# Patient Record
Sex: Female | Born: 1955 | Race: White | Hispanic: No | Marital: Married | Smoking: Current every day smoker
Health system: Southern US, Community
[De-identification: ages and names within clinical notes are randomized; demographics above are authoritative.]

## PROBLEM LIST (undated history)

## (undated) DIAGNOSIS — I10 Essential (primary) hypertension: Secondary | ICD-10-CM

## (undated) DIAGNOSIS — C801 Malignant (primary) neoplasm, unspecified: Secondary | ICD-10-CM

## (undated) DIAGNOSIS — E119 Type 2 diabetes mellitus without complications: Secondary | ICD-10-CM

## (undated) DIAGNOSIS — G709 Myoneural disorder, unspecified: Secondary | ICD-10-CM

## (undated) DIAGNOSIS — G43909 Migraine, unspecified, not intractable, without status migrainosus: Secondary | ICD-10-CM

## (undated) HISTORY — DX: Migraine, unspecified, not intractable, without status migrainosus: G43.909

## (undated) HISTORY — DX: Essential (primary) hypertension: I10

## (undated) HISTORY — PX: CHOLECYSTECTOMY: SHX55

## (undated) HISTORY — DX: Type 2 diabetes mellitus without complications: E11.9

## (undated) HISTORY — PX: NECK SURGERY: SHX720

## (undated) HISTORY — DX: Malignant (primary) neoplasm, unspecified: C80.1

## (undated) HISTORY — DX: Myoneural disorder, unspecified: G70.9

## (undated) HISTORY — PX: OTHER SURGICAL HISTORY: SHX169

---

## 2005-10-08 ENCOUNTER — Ambulatory Visit (HOSPITAL_COMMUNITY): Admission: RE | Admit: 2005-10-08 | Discharge: 2005-10-08 | Payer: Self-pay | Admitting: Neurology

## 2005-10-20 ENCOUNTER — Ambulatory Visit (HOSPITAL_COMMUNITY): Admission: RE | Admit: 2005-10-20 | Discharge: 2005-10-20 | Payer: Self-pay | Admitting: Family Medicine

## 2005-10-27 ENCOUNTER — Ambulatory Visit (HOSPITAL_COMMUNITY): Admission: RE | Admit: 2005-10-27 | Discharge: 2005-10-27 | Payer: Self-pay | Admitting: Family Medicine

## 2006-03-15 ENCOUNTER — Ambulatory Visit (HOSPITAL_COMMUNITY): Admission: RE | Admit: 2006-03-15 | Discharge: 2006-03-15 | Payer: Self-pay | Admitting: Family Medicine

## 2006-08-27 ENCOUNTER — Emergency Department (HOSPITAL_COMMUNITY): Admission: EM | Admit: 2006-08-27 | Discharge: 2006-08-27 | Payer: Self-pay | Admitting: Emergency Medicine

## 2006-11-14 ENCOUNTER — Ambulatory Visit (HOSPITAL_COMMUNITY): Admission: RE | Admit: 2006-11-14 | Discharge: 2006-11-14 | Payer: Self-pay | Admitting: Family Medicine

## 2007-02-26 ENCOUNTER — Inpatient Hospital Stay (HOSPITAL_COMMUNITY): Admission: EM | Admit: 2007-02-26 | Discharge: 2007-02-27 | Payer: Self-pay | Admitting: Emergency Medicine

## 2007-02-27 ENCOUNTER — Ambulatory Visit: Payer: Self-pay | Admitting: Internal Medicine

## 2009-02-03 ENCOUNTER — Ambulatory Visit: Payer: Self-pay | Admitting: Cardiology

## 2009-03-05 ENCOUNTER — Ambulatory Visit (HOSPITAL_COMMUNITY): Admission: RE | Admit: 2009-03-05 | Discharge: 2009-03-05 | Payer: Self-pay | Admitting: Cardiovascular Disease

## 2009-03-11 ENCOUNTER — Ambulatory Visit (HOSPITAL_COMMUNITY): Admission: RE | Admit: 2009-03-11 | Discharge: 2009-03-11 | Payer: Self-pay | Admitting: Cardiovascular Disease

## 2009-07-07 ENCOUNTER — Ambulatory Visit (HOSPITAL_COMMUNITY): Admission: RE | Admit: 2009-07-07 | Discharge: 2009-07-07 | Payer: Self-pay | Admitting: Gastroenterology

## 2010-11-14 ENCOUNTER — Encounter: Payer: Self-pay | Admitting: Family Medicine

## 2011-02-02 LAB — GLUCOSE, CAPILLARY: Glucose-Capillary: 111 mg/dL — ABNORMAL HIGH (ref 70–99)

## 2011-03-09 NOTE — Cardiovascular Report (Signed)
NAMEUMI, MAINOR NO.:  [id number]   MEDICAL RECORD NO.:  [id number]          PATIENT TYPE:  OIB   LOCATION:  2899                         FACILITY:  [facility]   PHYSICIAN:  Nanetta Batty, M.D.   DATE OF BIRTH:  Apr 09, 1956   DATE OF PROCEDURE:  DATE OF DISCHARGE:  03/11/2009                            CARDIAC CATHETERIZATION   Ms. Kivett is a 55 year old mildly overweight married Caucasian female  mother of 7, grandmother of 8 grandchildren who currently does not work  and was referred by Dr. Loney Hering for ongoing chest pain.  Risk factors  include ongoing tobacco abuse, noninsulin-requiring diabetes,  hyperlipidemia and hypertension.  She had a stress echo that showed  inferobasal wall motion abnormality.  She presents now for diagnostic  coronary arteriography to define anatomy and rule out ischemic etiology.   DESCRIPTION OF PROCEDURE:  The patient was brought to the Second Floor  [facility] Cardiac Cath Lab in a postabsorptive state.  She was  premedicated with p.o. Valium.  Her right groin was prepped and shaved  in the usual sterile fashion.  Xylocaine 1% was used for local  anesthesia.  A 6-French sheath was inserted into the right femoral  artery using standard Seldinger technique.  A 6-French right and left  Judkins diagnostic catheter as well as a 6-French pigtail catheter were  used for selective coronary angiography and left ventriculography  respectively.  Visipaque dye was used for the entirety of the case.  Retrograde aortic, left ventricular and pullback blood pressures were  recorded.   HEMODYNAMICS:  1. Aortic systolic pressure 124 and diastolic 68.  2. Left ventricular systolic pressure 120, end-diastolic pressure 12.   SELECTIVE CORONARY ANGIOGRAPHY:  1. Left main normal.  2. LAD normal.  3. Left circumflex normal.  4. Right coronary artery is dominant normal.   Left ventriculography; RAO left ventriculogram was performed using 25 mL  of  Visipaque dye at 12 mL per second.  The overall LVEF was estimated  greater than 60% without focal wall motion abnormalities.   IMPRESSION:  Ms. Dault has essentially normal coronary arteries and  normal LV function.  I believe her chest pain is noncardiac.  Antireflux  therapy will be recommended.   The sheath was removed and pressure was held in the groin to achieve  hemostasis.  The patient left the lab in stable condition.  She will be  discharged home later today as an outpatient.  Will see me back in the  office in 1-2 weeks for followup.      Nanetta Batty, M.D.  Electronically Signed     JB/MEDQ  D:  03/11/2009  T:  03/12/2009  Job:  [id number]   cc:   Second Floor [facility]  [facility]  Ernestine Conrad, MD

## 2011-03-12 NOTE — Discharge Summary (Signed)
NAMERHILYN, BATTLE NO.:  [id number]   MEDICAL RECORD NO.:  [id number]          PATIENT TYPE:  INP   LOCATION:  A213                          FACILITY:  [facility]   PHYSICIAN:  Melvyn Novas, MDDATE OF BIRTH:  06-25-1956   DATE OF ADMISSION:  02/26/2007  DATE OF DISCHARGE:  05/05/2008LH                               DISCHARGE SUMMARY   The patient is a 55 year old white female with a history of  hypertension, some GERD, and hypothyroidism.  She was admitted to the  hospital with diffuse abdominal pain, worse post prandial, cramping and  diarrhea.  She denied any nausea, vomiting, melena, hematemesis, or  hematochezia.  She was admitted by Dr. Renard Matter.  Abdominal series x-rays  were within normal limits.  CT scan of her abdomen and pelvis with  contrast were essentially normal with the exception of a left uterine  adnexa mass consistent with either a fibroid or possible ovarian tissue.  This was not felt to be clinically significant to her course of action.  She was placed on clear liquids, had no recurrent nausea, vomiting, or  abdominal pain subsided.  The patient ate a biscuit from outside the  hospital, unknown to the hospital staff.  She seemed to tolerate this  well, although she did have a little diarrhea.  Abdomen was essentially  unremarkable.  Labs were within normal limits.  And, the aforementioned  studies were with no electrolyte abnormalities.   While in the hospital she had a vagal episode apparently, and was  hemodynamically stable throughout hospital stay having no anginal chest  pain.  She was subsequently discharged.   She is on:  1. Benicar 20/12.5  2. Nexium 40 a day.  3. Synthroid 50 mcg per day.   She will follow up in the office in 2 days' time.   The patient was given a discourse on lactose intolerance which may be  possible as part of the etiology of this pain and post prandial  diarrhea.  Will follow subsequent abstinence  for three days from lactose  foods and then re-institution, preferably on a day off, and follow  symptomatology.      Melvyn Novas, MD  Electronically Signed     RMD/MEDQ  D:  02/27/2007  T:  02/27/2007  Job:  [id number]

## 2011-03-12 NOTE — Consult Note (Signed)
Kelsey Conley, Kelsey Conley                 ACCOUNT NO.:  [id number]   MEDICAL RECORD NO.:  [id number]          PATIENT TYPE:  INP   LOCATION:  A213                          FACILITY:  [facility]   PHYSICIAN:  R. Roetta Sessions, M.D. DATE OF BIRTH:  March 26, 1956   DATE OF CONSULTATION:  02/27/2007  DATE OF DISCHARGE:                                 CONSULTATION   REASON FOR CONSULTATION:  Abdominal pain.   HISTORY OF PRESENT ILLNESS:  Kelsey Conley is a 55 year old Caucasian female  with complaints of low back pain which showed about 5 days ago, she felt  like she strained her back.  She also complains of myalgias, fever, and  malaise.  Approximately 2 days ago she began to develop right-sided  abdominal pain.  She describes the pain as sharp, intermittent, it  usually lasts a couple of minutes.  She also complains of loose stools  about three per day but only when she eats.  She complains of nausea  without emesis.  Denies any rectal bleeding or melena.  She has a  history of chronic heartburn and indigestion.  She tells me it is well  controlled on Nexium but from what she tells me it does sound like she  has breakthrough symptoms at least a couple of times per month.  She  complains of anorexia but denies any early satiety or weight loss.  She  complains of dizziness, severe headache.  She saw Dr. Janna Arch and was  diagnosed with the flu last week, she was given Avelox and has taken  three doses at home.  She had a CT of abdomen and pelvis with IV normal  contrast yesterday she was found to have a left adnexa soft tissue mass.  She has a history of a normal Pap in January and February of this year.  She also has a history of possible uterine or cervical lesion back many  years ago.   PAST MEDICAL AND SURGICAL HISTORY:  1. Hypertension.  2. Gastroesophageal reflux disease.  3. Hypothyroidism.  4. Chronic low back pain.  5. Diabetes mellitus.  6. Status post cholecystectomy.  7. Tubal ligation.  8. Lumpectomy.  9. History of cervical cancer or what she believes was possible      cervical cancer.  She also tells me she has had an abnormal lymph      node removed from her neck.  She is listed on the cancer registry      in [place].  She is listed on the cancer registry in [place]      [place], [place] but she does not recall what type of cancer      she had.   MEDICATIONS PRIOR TO ADMISSION:  1. Nexium 40 mg daily.  2. Celebrex 200 mg daily.  3. Benicar once daily.  4. Levothyroxine unknown dose daily.  5. Avelox.   ALLERGIES:  Aspirin causes nausea and vomiting.  Codeine causes nausea  and vomiting.   FAMILY HISTORY:  There is no known family history of carcinoma or  chronic GI problems.  Mother has multiple health  problems, in her 85s.  Father died in his 69s with history of CVA, aortic aneurysm and lung  cancer.  She has multiple siblings with a history positive for  uterine/cervical cancer, diabetes mellitus, hypertension.   SOCIAL HISTORY:  She has been married for 12 years, she has five healthy  children.  She is a Chiropodist.  She has a 34 pack-year  history of tobacco use.  Denies alcohol or drug use.   REVIEW OF SYSTEMS:  CONSTITUTION:  She is complaining of fatigue, see  HPI.  CARDIOVASCULAR:  No history of chest pain or palpitations,  shortness of breath, dyspnea, cough, or hemoptysis.  GI:  See HPI.  GYN:  Last menstrual period was March 2008.  She has very irregular cycles,  felt to be perimenopausal.  GU:  Denies any dysuria, hematuria,  increased urinary frequency.   PHYSICAL EXAMINATION:  VITAL SIGNS:  Temperature 97.9, pulse 48,  respirations 18, blood pressure 107/54, O2 sat 95% on 2 liters per  minute, height 68 inches, weight 205 pounds.  GENERAL:  Kelsey Conley is a 55 year old Caucasian female who is alert,  oriented, pleasant, cooperative, in no acute distress.  She was  accompanied by her daughter.  HEENT:  Sclerae are clear,  nonicteric.  Conjunctivae are pink.  Oropharynx pink and moist without lesions.  NECK:  Supple without mass or thyromegaly.  CHEST:  Heart regular rhythm, normal S1 and S2.  Apical pulse is 50.  LUNGS:  Clear to auscultation bilaterally.  ABDOMEN:  Positive bowel sounds x4.  No bruits auscultated.  She does  have mild tenderness to the right upper quadrant and right lower  quadrant and around the umbilicus on palpation.  No rebound tenderness  or guarding.  EXTREMITIES:  Without clubbing or edema bilaterally.   LABORATORY DATA:  The labs from Feb 26, 2007, WBC 3.4, hemoglobin 13.7,  hematocrit 40, platelets 149,000, calcium 9.1, sodium 139, potassium  3.9, chloride 103, CO2 25, BUN 10, creatinine 0.66 and glucose 130.  Total bilirubin is 0.4, alkaline phosphatase 666, AST 32, ALT 37 total  protein 6.9, albumin 3.7.  Urinalysis has a high specific gravity.  Blood cultures negative times 24 hours.   IMPRESSION:  [name] is a 55 year old female with flu-like symptoms  including dizziness, headache, nausea, myalgias, low back pain, fever  and malaise also complains of right abdominal pain is intermittent,  lasts a few minutes and a few loose stools.  She has a history of  chronic gastroesophageal reflux disease.  CT shows left adnexal soft  tissue mass which will need further characterization but does not seem  to correlate with pain.  The etiology of the pain is unclear but may be  linked to an acute viral illness, it is possible she could have referred  back pain.  She seems much improved now and says I want to go home.  I  am also concerned about her breathing cardia and a low back pain and  unsure as to whether these two things are related.   PLAN:  1. Primary care to address for bradycardia workup as well as further      imaging of her left adnexal mass and possibly imaging of her      complaints of back pain.  2. Add PPI daily while here. 3. Supportive measures.  4. We will  recheck LFTs, CBC in the morning and we will continue to      follow and see what becomes  of her abdominal pain.  We would like      to thank Dr. Renard Matter for allowing [place] to participate in the care of      Ms. Victory Dakin.   PROCEDURE:  As well as Dr. room for signature and 37 is a Dr. Roe Coombs he  agrees.  Thank you      Nicholas Lose, N.P.      Jonathon Bellows, M.D.  Electronically Signed    KC/MEDQ  D:  02/27/2007  T:  02/27/2007  Job:  [id number]   cc:   Melvyn Novas, MD  Fax: [phone]

## 2011-03-12 NOTE — H&P (Signed)
NAMEKALKIDAN, CAUDELL                 ACCOUNT NO.:  [id number]   MEDICAL RECORD NO.:  [id number]          PATIENT TYPE:  INP   LOCATION:  A213                          FACILITY:  [facility]   PHYSICIAN:  Angus G. Renard Matter, MD   DATE OF BIRTH:  06/03/1956   DATE OF ADMISSION:  02/26/2007  DATE OF DISCHARGE:  LH                              HISTORY & PHYSICAL   HISTORY OF PRESENT ILLNESS:  A 55 year old female who presented to the  ED with a chief complaint being abdominal pain.  This had been present  for about 3 days.  Apparently, she had flu-like symptoms and had been  placed on an antibiotic, Avelox.  She continued to have abdominal pain,  nausea, fever, generalized body aches.  The pain is located in the upper  abdomen characterized as colicky and crampy pain, rated 10/10 at its  worst.  The patient did have lab work done through the ED. CBC revealed  WBC of 3400 with hemoglobin 13.7, hematocrit 40.0.  Chemistry revealed  sodium of 139, potassium 3.9, chloride 103, CO2 of 25, glucose 130, BUN  10, creatinine 0.66.  GFR greater than 60.  Urinalysis essentially  negative.  X-rays - the patient had a CT of the abdomen.  Appendix and  terminal ileum normal.  A soft tissue mass adjacent to the fundus of the  uterus.  Acute abdominal series was essentially negative.  The patient  was subsequently admitted and was given Dilaudid for pain in the ED and  started on intravenous fluids.   SOCIAL HISTORY:  The patient does not drink alcohol.  Discontinued  smoking recently.   FAMILY HISTORY:  Positive for of coronary artery disease, cancer,  diabetes and hypertension.   PAST MEDICAL HISTORY:  1. History of hypertension.  2. Gastroesophageal reflux.  3. Hypothyroidism.  4. Low back pain.   ALLERGIES:  1. ASPIRIN.  2. CODEINE.   MEDICATION LIST:  1. Nexium 40 mg daily.  2. Celebrex 200 mg daily.  3. Benicar daily.  4. Levothyroxine daily.   REVIEW OF SYSTEMS:  HEENT:  Negative.   CARDIOVASCULAR:  Negative for  chest pain.  No cough, dyspnea or wheezing.  GI:  The patient has had  nausea, abdominal pain and diarrhea.  GU:  No dysuria or hematuria.   PHYSICAL EXAMINATION:  VITAL SIGNS:  Blood pressure 103/71, pulse 64,  respirations 20, temperature 98.  HEENT:  Eyes - pupils equal, round and reactive to light and  accommodation. Tympanic membranes negative.  Oropharynx benign.  NECK:  Supple.  No JVD or thyroid abnormalities.  HEART:  Regular rhythm, no murmurs.  LUNGS:  Clear to percussion and auscultation.  ABDOMEN:  No palpable organs, no masses.  Tenderness in the right lower  quadrant with muscle guarding and rebound tenderness.   ASSESSMENT:  The patient was admitted with abdominal pain, right lower  quadrant, undetermined etiology.      Angus G. Renard Matter, MD  Electronically Signed     AGM/MEDQ  D:  02/26/2007  T:  02/26/2007  Job:  [id number]

## 2012-12-28 DIAGNOSIS — E1149 Type 2 diabetes mellitus with other diabetic neurological complication: Secondary | ICD-10-CM | POA: Diagnosis not present

## 2012-12-28 DIAGNOSIS — R209 Unspecified disturbances of skin sensation: Secondary | ICD-10-CM | POA: Diagnosis not present

## 2012-12-28 DIAGNOSIS — K219 Gastro-esophageal reflux disease without esophagitis: Secondary | ICD-10-CM | POA: Diagnosis not present

## 2012-12-28 DIAGNOSIS — I10 Essential (primary) hypertension: Secondary | ICD-10-CM | POA: Diagnosis not present

## 2012-12-28 DIAGNOSIS — E785 Hyperlipidemia, unspecified: Secondary | ICD-10-CM | POA: Diagnosis not present

## 2012-12-28 DIAGNOSIS — E039 Hypothyroidism, unspecified: Secondary | ICD-10-CM | POA: Diagnosis not present

## 2013-01-22 DIAGNOSIS — E1139 Type 2 diabetes mellitus with other diabetic ophthalmic complication: Secondary | ICD-10-CM | POA: Diagnosis not present

## 2013-01-22 DIAGNOSIS — H18519 Endothelial corneal dystrophy, unspecified eye: Secondary | ICD-10-CM | POA: Diagnosis not present

## 2013-01-22 DIAGNOSIS — H5200 Hypermetropia, unspecified eye: Secondary | ICD-10-CM | POA: Diagnosis not present

## 2013-01-22 DIAGNOSIS — H2510 Age-related nuclear cataract, unspecified eye: Secondary | ICD-10-CM | POA: Diagnosis not present

## 2013-01-29 DIAGNOSIS — E785 Hyperlipidemia, unspecified: Secondary | ICD-10-CM | POA: Diagnosis not present

## 2013-01-29 DIAGNOSIS — I10 Essential (primary) hypertension: Secondary | ICD-10-CM | POA: Diagnosis not present

## 2013-01-29 DIAGNOSIS — E1149 Type 2 diabetes mellitus with other diabetic neurological complication: Secondary | ICD-10-CM | POA: Diagnosis not present

## 2013-02-01 ENCOUNTER — Telehealth (HOSPITAL_COMMUNITY): Payer: Self-pay | Admitting: Dietician

## 2013-02-01 NOTE — Telephone Encounter (Signed)
Received voicemail from [name] in referrals at [name] left at [phone]. Referral from Dr. Gaylyn Lambert for dx: diabetes. Appointment scheduled for 02/08/13 at 0900. Phone number and fax provided to send medical records.

## 2013-02-02 DIAGNOSIS — E1139 Type 2 diabetes mellitus with other diabetic ophthalmic complication: Secondary | ICD-10-CM | POA: Diagnosis not present

## 2013-02-02 DIAGNOSIS — H18519 Endothelial corneal dystrophy, unspecified eye: Secondary | ICD-10-CM | POA: Diagnosis not present

## 2013-02-06 ENCOUNTER — Ambulatory Visit (INDEPENDENT_AMBULATORY_CARE_PROVIDER_SITE_OTHER): Payer: Medicare Other | Admitting: Obstetrics & Gynecology

## 2013-02-06 ENCOUNTER — Encounter: Payer: Self-pay | Admitting: Obstetrics & Gynecology

## 2013-02-06 VITALS — BP 120/80 | Ht 68.0 in | Wt 202.0 lb

## 2013-02-06 DIAGNOSIS — N813 Complete uterovaginal prolapse: Secondary | ICD-10-CM | POA: Insufficient documentation

## 2013-02-06 DIAGNOSIS — N814 Uterovaginal prolapse, unspecified: Secondary | ICD-10-CM

## 2013-02-06 NOTE — Patient Instructions (Signed)
Prolapse  Prolapse means the falling down, bulging, dropping, or drooping of a body part. Organs that commonly prolapse include the rectum, small intestine, bladder, urethra, vagina (birth canal), uterus (womb), and cervix. Prolapse occurs when the ligaments and muscle tissue around the rectum, bladder, and uterus are damaged or weakened.  CAUSES  This happens especially with:  Childbirth. Some women feel pelvic pressure or have trouble holding their urine right after childbirth, because of stretching and tearing of pelvic tissues. This generally gets better with time and the feeling usually goes away, but it may return with aging.  Chronic heavy lifting.  Aging.  Menopause, with loss of estrogen production weakening the pelvic ligaments and muscles.  Past pelvic surgery.  Obesity.  Chronic constipation.  Chronic cough. Prolapse may affect a single organ, or several organs may prolapse at the same time. The front wall of the vagina holds up the bladder. The back wall holds up part of the lower intestine, or rectum. The uterus fills a spot in the middle. All these organs can be involved when the ligaments and muscles around the vagina relax too much. This often gets worse when women stop producing estrogen (menopause). SYMPTOMS  Uncontrolled loss of urine (incontinence) with cough, sneeze, straining, and exercise.  More force may be required to have a bowel movement, due to trapping of the stool.  When part of an organ bulges through the opening of the vagina, there is sometimes a feeling of heaviness or pressure. It may feel as though something is falling out. This sensation increases with coughing or bearing down.  If the organs protrude through the opening of the vagina and rub against the clothing, there may be soreness, ulcers, infection, pain, and bleeding.  Lower back pain.  Pushing in the upper or lower part of the vagina, to pass urine or have a bowel movement.  Problems  having sexual intercourse.  Being unable to insert a tampon or applicator. DIAGNOSIS  Usually, a physical exam is all that is needed to identify the problem. During the examination, you may be asked to cough and strain while lying down, sitting up, and standing up. Your caregiver will determine if more testing is required, such as bladder function tests. Some diagnoses are:  Cystocele: Bulging and falling of the bladder into the top of the vagina.  Rectocele: Part of the rectum bulging into the vagina.  Prolapse of the uterus: The uterus falls or drops into the vagina.  Enterocele: Bulging of the top of the vagina, after a hysterectomy (uterus removal), with the small intestine bulging into the vagina. A hernia in the top of the vagina.  Urethrocele: The urethra (urine carrying tube) bulging into the vagina. TREATMENT  In most cases, prolapse needs to be treated only if it produces symptoms. If the symptoms are interfering with your usual daily or sexual activities, treatment may be necessary. The following are some measures that may be used to treat prolapse.  Estrogen may help elderly women with mild prolapse.  Kegel exercises may help mild cases of prolapse, by strengthening and tightening the muscles of the pelvic floor.  Pessaries are used in women who choose not to, or are unable to, have surgery. A pessary is a doughnut-shaped piece of plastic or rubber that is put into the vagina to keep the organs in place. This device must be fitted by your caregiver. Your caregiver will also explain how to care for yourself with the pessary. If it works well for you,   to keep the organs in place. This device must be fitted by your caregiver. Your caregiver will also explain how to care for yourself with the pessary. If it works well for you, this may be the only treatment required.   Surgery is often the only form of treatment for more severe prolapses. There are different types of surgery available. You should discuss what the best procedure is for you. If the uterus is prolapsed, it may be removed (hysterectomy) as part of the surgical treatment. Your caregiver will  discuss the risks and benefits with you.   Uterine-vaginal suspension (surgery to hold up the organs) may be used, especially if you want to maintain your fertility.  No form of treatment is guaranteed to correct the prolapse or relieve the symptoms.  HOME CARE INSTRUCTIONS    Wear a sanitary pad or absorbent product if you have incontinence of urine.   Avoid heavy lifting and straining with exercise and work.   Take over-the-counter pain medicine for minor discomfort.   Try taking estrogen or using estrogen vaginal cream.   Try Kegel exercises or use a pessary, before deciding to have surgery.   Do Kegel exercises after having a baby.  SEEK MEDICAL CARE IF:    Your symptoms interfere with your daily activities.   You need medicine to help with the discomfort.   You need to be fitted with a pessary.   You notice bleeding from the vagina.   You think you have ulcers or you notice ulcers on the cervix.   You have an oral temperature above 102 F (38.9 C).   You develop pain or blood with urination.   You have bleeding with a bowel movement.   The symptoms are interfering with your sex life.   You have urinary incontinence that interferes with your daily activities.   You lose urine with sexual intercourse.   You have a chronic cough.   You have chronic constipation.  Document Released: 04/17/2003 Document Revised: 01/03/2012 Document Reviewed: 10/26/2009  [employer] Patient Information 2013 [employer].

## 2013-02-07 NOTE — Progress Notes (Signed)
Patient ID: AMELIE CARACCI, female   DOB: 1956/02/15, 57 y.o.   MRN: [id number] Has felt like "something falling out of my vagina" for 2-3 years.  No acute changes Incontinence with urgency no real stress symptoms No fecal incontinence Has sex less than 1 time per month, libido + erection dysfunction  Exam Grade II-III prolapse of anterior vaginal wall, cystocoele Grade II uterine relaxation Grade II-III distal rectal prolapse, pt denies splinting  Talked extensively with pt regarding physical findings.  She is not interested in surgery at this point and i agree, she is a smoker and would need to stop prior to surgery and needs to be older or more severe findings Wants to try a pessary, so will see backin 2 weeks to fit with a milex ring with support  EURE,LUTHER H 02/07/2013 9:38 PM

## 2013-02-08 ENCOUNTER — Encounter (HOSPITAL_COMMUNITY): Payer: Self-pay | Admitting: Dietician

## 2013-02-08 NOTE — Progress Notes (Signed)
Outpatient Initial Nutrition Assessment  Date:02/08/2013   Appt Start Time: [id number]  Referring Physician: Dr. Elias Else [facility]) Reason for Visit: diabetes  Nutrition Assessment:  Height: 5\' 9"  (175.3 cm)   Weight: 206 lb (93.441 kg)   IBW: 145# %IBW: 142% UBW: 206# %UBW: 100% Body mass index is 30.41 kg/(m^2). Classified as obesity, class I.  Goal Weight: 186# (10% loss of current body weight) Weight hx: Pt reports UBW around 200-205#. Pt c/o gaining weight, even with minimal food intake and decreased appetite. She has been actively trying to lose weight.   Estimated nutritional needs: 1600-1700 kcals daily, 75-94 grams protein daily, 1.6-1.7 L fluid daily  PMH:  Past Medical History  Diagnosis Date  . Diabetes mellitus without complication   . Hypertension   . Cancer     lymph node right neck  . Neuromuscular disorder     neuropathy  . Migraines     Medications:  Current Outpatient Rx  Name  Route  Sig  Dispense  Refill  . DULoxetine (CYMBALTA) 60 MG capsule   Oral   Take 60 mg by mouth 2 (two) times daily.         Marland Kitchen esomeprazole (NEXIUM) 40 MG capsule   Oral   Take 40 mg by mouth 2 (two) times daily.         Marland Kitchen gabapentin (NEURONTIN) 300 MG capsule   Oral   Take 300 mg by mouth 3 (three) times daily.         Marland Kitchen glimepiride (AMARYL) 2 MG tablet   Oral   Take 2 mg by mouth 2 (two) times daily.         Marland Kitchen levothyroxine (SYNTHROID, LEVOTHROID) 175 MCG tablet   Oral   Take 175 mcg by mouth daily before breakfast.         . metFORMIN (GLUCOPHAGE) 1000 MG tablet   Oral   Take 1,000 mg by mouth 2 (two) times daily with a meal.         . olmesartan-hydrochlorothiazide (BENICAR HCT) 20-12.5 MG per tablet   Oral   Take 1 tablet by mouth daily.         . Polyethylene Glycol 3350 POWD   Does not apply   3,350 g by Does not apply route as needed.         . pravastatin (PRAVACHOL) 40 MG tablet   Oral   Take 40 mg by mouth at  bedtime.           Labs: CMP  No results found for this basename: na, k, cl, co2, glucose, bun, creatinine, calcium, prot, albumin, ast, alt, alkphos, bilitot, gfrnonaa, gfraa    Lipid Panel  No results found for this basename: chol, trig, hdl, cholhdl, vldl, ldlcalc     No results found for this basename: HGBA1C   No results found for this basename: GLUF, MICROALBUR, LDLCALC, CREATININE     Lifestyle/ social habits: Ms. Searles is a very pleasant woman who lives in [place] with her husband and adult son. She also frequently cares for her two 72 year old granddaughters. She is currently on disability. She reports her stress level as very high, reporting her multiple family obligations and her health as her main sources of stress. She smokes, but reports she does not use alcohol or illegal drugs. She does not follow a structured exercise plan. She reports decreased energy levels. She is active with her grandchildren and reports that she has  started mowing her lawn with a push mower.    Nutrition hx/habits: Ms Saulter reports that she has had diabetes for about 8 years and has never received diabetes education in the past. She is very eager to learn. She reports "my last doctor just told me to avoid sugar". She has recently been reducing fried foods and salt in her diet. She reports that she has had problems with glycemic control since she has been diagnosed, but most recently about a few months ago; due to insurance issues, she did not have a PCP for over 1 year and she decreased her dosages of medications to help her get by. She was not checking her glucose at that time because she ran out of testing strips. CBGs typically run between 132-219 per log, however since pt was pt on Glimepiride (around 01/30/13) pt log indicates AM fasting readings between 97-188 and PM postprandial readings between 91-210. She reports she typically eats 1 meal per day, due to consistent nausea. She reports if she tries to eat  in the morning, she will throw up her food and medications. She also complains of bloating, gas, diarrhea, and constipation. She has neuropathy and reports her hands and feet are numb. She does not eat much meat (she reports she gets constipated if she eats it), but get protein by way of nuts and peanut butter. She consumes a lot of vegetables and fruits.   Diet recall: Breakfast: 2 cups of coffee with sugar and creamer; Dinner (3-4 PM): pinto beans, onions, and turnip greens  Nutrition Diagnosis: Inconsistent carbohydrate intake r/t skipping meals AEB diet recall.   Nutrition Intervention:  Nutrition rx: 1500 kcal NAS, diabetic diet; 4-6 small meals per day (15-30 grams carbohydrates); low calorie beverages only; physical activity as tolerated  Education/Counseling Provided: Educated pt on principles of diabetic diet. Discussed carbohydrate metabolism in relation to diabetes. Educated pt on basic self-management principles including: signs and symptoms of hyperglycemia and hypoglycemia, goals for fasting and postprandial blood sugars, goals for Hgb A1c, importance of checking feet, importance of keeping PCP appointments, and foot care. Educated pt on plate method, portion sizes, and sources of carbohydrate. Discussed importance of regular meal pattern. Discussed importance of adding sources of whole grains to diet to improve glycemic control. Also encouraged to choose low fat dairy, lean meats, and whole fruits and vegetables more often. Discussed options of artificial sweeteners and encouraged pt to use which brand she liked best. Discussed nutritional content of foods commonly eaten and discussed healthier alternatives. Discussed ways  To increase PO intake and eat on a schedule; recommended eating small, frequent meals 4-6 times per day in order to help stabilize glycemic levels and promote adequate intake. Discussed importance of compliance to prevent further complications of disease. Educated pt on  importance of physical activity (goal of at least 30 minutes 5 times per week) along with a healthy diet to achieve weight loss and glycemic goals. Encouraged slow, moderate weight loss of 1-2# per week, or 7-10% of current body weight. Provided plate method, "Carbdohydrate Counting and Meal Planning", "My Blood Sugar Diary", "Guice to Better Office Visits", and "Diabetes and You". handouts. Used TeachBack to assess understanding.   Understanding, Motivation, Ability to Follow Recommendations: Expect fair compliance.   Monitoring and Evaluation: Goals: 1) 1-2# wt loss per week; 2) 4-6 small meals per day; 3) Hgb A1c < 7,0; 4) Physical activity as tolerated  Recommendations: 1) Break up meals into smaller, more frequent meals (4-6 small meals per  day); 2) Break up exercise into smaller, more frequent sessions; 3) Take some time for yourself to explore hobbies and interests to help relieve stress; 4) Pt may benefit from a GI consultation (pt c/o early satiety, frequent nausea); 5) Pt may also benefit from a meal replacement supplement (ex. Glucerna or Ensure)  F/U: PRN. Provided RD contact information.   Melody Haver, RD, LDN 02/08/2013  Appt EndTime: 1115

## 2013-02-20 ENCOUNTER — Ambulatory Visit: Payer: Medicare Other | Admitting: Obstetrics & Gynecology

## 2013-02-27 DIAGNOSIS — J9801 Acute bronchospasm: Secondary | ICD-10-CM | POA: Diagnosis not present

## 2013-02-27 DIAGNOSIS — E119 Type 2 diabetes mellitus without complications: Secondary | ICD-10-CM | POA: Diagnosis not present

## 2013-02-27 DIAGNOSIS — E785 Hyperlipidemia, unspecified: Secondary | ICD-10-CM | POA: Diagnosis not present

## 2013-02-27 DIAGNOSIS — F17200 Nicotine dependence, unspecified, uncomplicated: Secondary | ICD-10-CM | POA: Diagnosis not present

## 2013-03-05 DIAGNOSIS — H18519 Endothelial corneal dystrophy, unspecified eye: Secondary | ICD-10-CM | POA: Diagnosis not present

## 2013-03-05 DIAGNOSIS — H524 Presbyopia: Secondary | ICD-10-CM | POA: Diagnosis not present

## 2013-03-08 DIAGNOSIS — E785 Hyperlipidemia, unspecified: Secondary | ICD-10-CM | POA: Diagnosis not present

## 2013-04-10 DIAGNOSIS — H53149 Visual discomfort, unspecified: Secondary | ICD-10-CM | POA: Diagnosis not present

## 2013-04-10 DIAGNOSIS — H18519 Endothelial corneal dystrophy, unspecified eye: Secondary | ICD-10-CM | POA: Diagnosis not present

## 2013-04-10 DIAGNOSIS — H2510 Age-related nuclear cataract, unspecified eye: Secondary | ICD-10-CM | POA: Diagnosis not present

## 2013-05-11 DIAGNOSIS — H53149 Visual discomfort, unspecified: Secondary | ICD-10-CM | POA: Diagnosis not present

## 2013-05-11 DIAGNOSIS — H18519 Endothelial corneal dystrophy, unspecified eye: Secondary | ICD-10-CM | POA: Diagnosis not present

## 2013-06-07 DIAGNOSIS — H903 Sensorineural hearing loss, bilateral: Secondary | ICD-10-CM | POA: Diagnosis not present

## 2013-06-07 DIAGNOSIS — H9319 Tinnitus, unspecified ear: Secondary | ICD-10-CM | POA: Diagnosis not present

## 2013-07-19 ENCOUNTER — Ambulatory Visit (INDEPENDENT_AMBULATORY_CARE_PROVIDER_SITE_OTHER): Payer: Medicare Other | Admitting: Nurse Practitioner

## 2013-07-19 ENCOUNTER — Encounter: Payer: Self-pay | Admitting: Nurse Practitioner

## 2013-07-19 VITALS — BP 128/86 | HR 67 | Ht 69.5 in | Wt 212.0 lb

## 2013-07-19 DIAGNOSIS — R296 Repeated falls: Secondary | ICD-10-CM

## 2013-07-19 DIAGNOSIS — Z9181 History of falling: Secondary | ICD-10-CM | POA: Diagnosis not present

## 2013-07-19 DIAGNOSIS — G609 Hereditary and idiopathic neuropathy, unspecified: Secondary | ICD-10-CM | POA: Diagnosis not present

## 2013-07-19 DIAGNOSIS — R269 Unspecified abnormalities of gait and mobility: Secondary | ICD-10-CM

## 2013-07-19 DIAGNOSIS — E119 Type 2 diabetes mellitus without complications: Secondary | ICD-10-CM | POA: Diagnosis not present

## 2013-07-19 NOTE — Progress Notes (Signed)
[facility]  PATIENT: Kelsey Conley DOB: 11/19/1955   REASON FOR VISIT: Followup for neuropathy   HISTORY OF PRESENT ILLNESS:Ms Kelsey Conley, is a 57 year old right-handed Caucasian female, initially evaluated by Dr. Debarah Crape 11/05/2011 for peripheral neuropathy. She has history of hypertension hyperlipidemia, continues to smoke and has type 2 diabetes. She is currently on Cymbalta 60 twice daily and gabapentin 300 , 3 tabs daily. She continues to complain of numbness and pain in both feet particularly the right foot. She also complains of bilateral hand numbness and pain, which may have progressed since last seen over 18 months. Labs for treatable causes of peripheral neuropathy all returned normal. EMG nerve conduction done 11/18/2011 with no evidence of large fiber peripheral neuropathy or right lumbar sacral radiculopathy. Patient says she has 40% hearing loss. She complains of multiple falls without reason. She does not use an assistive device. She is alone at today's visit however apparently her husband is in the lobby. She has never had CT or MRI of the brain. Also has mild tremor of the right hand.   HISTORY:She has past medical history of type 2 diabetes, most recent A1c was 6.8, previously was 8.4, also with past medical history of hypertension, hyperlipidemia, long-standing smoking. She reported 5 years history of gradual onset slow progressing bilateral feet paresthesia, she describe burning sensation in her toes, sometimes extremely cold, paresthesia in plantar surface of her feet, she also complains bilateral hands deep achy pain, swollen sensation, she has been taking gabapentin 600 mg 3 times a day with no help at all. She complains of gait difficulty because of the pain, also difficulty holding objects but She complains diffuse body muscle and joints achy pain, low back pain, frequent chest pain, reported previous negative cardiac workup   REVIEW OF SYSTEMS: Full 14  system review of systems performed and notable only for:  Constitutional: Weight gain, fatigue Cardiovascular: Chest pain, swelling in the legs Ear/Nose/Throat: Hearing loss  Skin: N/A  Eyes: Loss of vision, Fuch's Dystrophy Respiratory: Shortness of breath Gastroitestinal: N/A  Hematology/Lymphatic: Easy bruising  Endocrine: Feeling hot, increased thirst Musculoskeletal: Cramping  Allergy/Immunology: N/A  Neurological: Confusion, headache, tremor, weakness  Psychiatric: Decreased energy, insomnia  ALLERGIES: Allergies  Allergen Reactions  . Aspirin Nausea And Vomiting    headaches  . Codeine Nausea And Vomiting    HOME MEDICATIONS: Outpatient Prescriptions Prior to Visit  Medication Sig Dispense Refill  . DULoxetine (CYMBALTA) 60 MG capsule Take 60 mg by mouth 2 (two) times daily.      Marland Kitchen esomeprazole (NEXIUM) 40 MG capsule Take 40 mg by mouth 2 (two) times daily.      Marland Kitchen gabapentin (NEURONTIN) 300 MG capsule Take 300 mg by mouth 3 (three) times daily.      Marland Kitchen glimepiride (AMARYL) 2 MG tablet Take 2 mg by mouth 2 (two) times daily.      Marland Kitchen levothyroxine (SYNTHROID, LEVOTHROID) 175 MCG tablet Take 175 mcg by mouth daily before breakfast.      . metFORMIN (GLUCOPHAGE) 1000 MG tablet Take 1,000 mg by mouth 2 (two) times daily with a meal.      . olmesartan-hydrochlorothiazide (BENICAR HCT) 20-12.5 MG per tablet Take 1 tablet by mouth daily.      . Polyethylene Glycol 3350 POWD 3,350 g by Does not apply route as needed.      . pravastatin (PRAVACHOL) 40 MG tablet Take 40 mg by mouth at bedtime.       No facility-administered medications prior  to visit.    PAST MEDICAL HISTORY: Past Medical History  Diagnosis Date  . Diabetes mellitus without complication   . Hypertension   . Cancer     lymph node right neck  . Neuromuscular disorder     neuropathy  . Migraines     PAST SURGICAL HISTORY: Past Surgical History  Procedure Laterality Date  . Cholecystectomy    . Lower back  surgery      L4, L5, L3  . Tubaligation    . Neck surgery      FAMILY HISTORY: Family History  Problem Relation Age of Onset  . Migraines Mother   . Crohn's disease Mother   . Hypertension Mother   . Stroke Father   . Migraines Sister   . Diabetes Sister   . Hypertension Sister   . Thyroid disease Sister   . Cancer Maternal Aunt   . Cancer Maternal Uncle   . Cancer Paternal Uncle     brain, lung, liver, stomach  . Cancer Maternal Grandmother   . Diabetes Maternal Grandmother   . Cancer Maternal Grandfather     prostate    SOCIAL HISTORY: History   Social History  . Marital Status: Married    Spouse Name: N/A    Number of Children: N/A  . Years of Education: N/A   Occupational History  . Not on file.   Social History Main Topics  . Smoking status: Current Every Day Smoker -- 1.00 packs/day    Types: Cigarettes  . Smokeless tobacco: Not on file  . Alcohol Use: No  . Drug Use: No  . Sexual Activity: Yes   Other Topics Concern  . Not on file   Social History Narrative  . No narrative on file     PHYSICAL EXAM  Filed Vitals:   07/19/13 0858 07/19/13 0900  BP: 137/85 128/86  Pulse: 61 67  Height: 5' 9.5" (1.765 m)   Weight: 212 lb (96.163 kg)    Body mass index is 30.87 kg/(m^2).  Generalized: Well developed, obese female in no acute distress  Head: normocephalic and atraumatic,. Oropharynx benign  Neck: Supple, no carotid bruits  Cardiac: Regular rate rhythm, no murmur  Musculoskeletal: No deformity   Neurological examination   Mentation: Depressed in appearance . Alert oriented to time, place, history taking. Follows all commands speech and language fluent  Cranial nerve II-XII: Fundoscopic exam reveals sharp disc margins.Pupils were equal round reactive to light extraocular movements were full, visual field were full on confrontational test. Facial sensation and strength were normal. hearing was agrees bilaterally  Uvula tongue midline. head  turning and shoulder shrug and were normal and symmetric.Tongue protrusion into cheek strength was normal. Motor: normal bulk and tone, full strength in the BUE, BLE, fine finger movements normal, no pronator drift. No focal weakness Sensory: Length-dependent decreased to light touch, pinprick to mid shin and vibratory to Ankle, normal proprioception  Coordination: finger-nose-finger, tremor noted on right hand ,  no dysmetria Reflexes: Brachioradialis 2/2, biceps 2/2, triceps 2/2, patellar 2/2, Achilles 1/1 plantar responses were flexor bilaterally. Gait and Station: Rising up from seated position without assistance, narrow-based gait, labs on the right, able to tip toe and heel walk, unsteady with tandem. Romberg positive   DIAGNOSTIC DATA (LABS, IMAGING, TESTING) - None for review    ASSESSMENT AND PLAN  57 y.o. year old female  has a past medical history of Diabetes mellitus without complication; Hypertension; hyperlipidemia, peripheral neuropathy, gait abnormality, frequent falls for  followup. CMP after last visit 11/18/2011 normal except for elevated glucose, CBC normal TSH normal ANA negative, sedimentation rate normal vitamin B12 normal. Protein electrophoresis normal  Will get BMP today Schedule for MRI of the brain due to frequent falls and hand tremor Continue Gabapentin and Cymbalta for burning foot pain Discussed with Dr. Carmelina Noun, [facility], [facility], APRN  [facility] [address] [place], [place] [address] [phone]

## 2013-07-19 NOTE — Patient Instructions (Addendum)
Will get BMP today Schedule for MRI of the brain Continue Gabapentin and Cymbalta for burning foot pain

## 2013-07-20 ENCOUNTER — Other Ambulatory Visit (INDEPENDENT_AMBULATORY_CARE_PROVIDER_SITE_OTHER): Payer: Self-pay

## 2013-07-20 DIAGNOSIS — E119 Type 2 diabetes mellitus without complications: Secondary | ICD-10-CM | POA: Diagnosis not present

## 2013-07-20 DIAGNOSIS — Z0289 Encounter for other administrative examinations: Secondary | ICD-10-CM

## 2013-07-20 DIAGNOSIS — G609 Hereditary and idiopathic neuropathy, unspecified: Secondary | ICD-10-CM | POA: Diagnosis not present

## 2013-07-20 LAB — BASIC METABOLIC PANEL
Chloride: 99 mmol/L (ref 96–108)
GFR calc Af Amer: 113 mL/min/{1.73_m2} (ref >59)
GFR calc non Af Amer: 98 mL/min/{1.73_m2} (ref >59)
Potassium: 4.6 mmol/L (ref 3.5–5.2)
Sodium: 139 mmol/L (ref 134–144)

## 2013-07-22 DIAGNOSIS — R279 Unspecified lack of coordination: Secondary | ICD-10-CM

## 2013-07-23 ENCOUNTER — Ambulatory Visit
Admission: RE | Admit: 2013-07-23 | Discharge: 2013-07-23 | Disposition: A | Discharge status: Home / Self Care | Payer: Medicare Other | Source: Ambulatory Visit | Attending: Nurse Practitioner | Admitting: Nurse Practitioner

## 2013-07-23 DIAGNOSIS — R296 Repeated falls: Secondary | ICD-10-CM

## 2013-07-23 DIAGNOSIS — R269 Unspecified abnormalities of gait and mobility: Secondary | ICD-10-CM

## 2013-07-23 MED ORDER — GADOBENATE DIMEGLUMINE 529 MG/ML IV SOLN
20.0000 mL | Freq: Once | INTRAVENOUS | Status: AC | PRN
  Administered 2013-07-23: 20 mL via INTRAVENOUS

## 2013-07-24 NOTE — Progress Notes (Signed)
Quick Note:  Spoke with patient and relayed results of blood work. Patient understood and had no questions.  ______ 

## 2013-07-25 NOTE — Progress Notes (Signed)
Quick Note:  I called pt and normal brain scan results. She stated ok. ______

## 2013-08-30 DIAGNOSIS — Z23 Encounter for immunization: Secondary | ICD-10-CM | POA: Diagnosis not present

## 2013-08-30 DIAGNOSIS — E785 Hyperlipidemia, unspecified: Secondary | ICD-10-CM | POA: Diagnosis not present

## 2013-08-30 DIAGNOSIS — R209 Unspecified disturbances of skin sensation: Secondary | ICD-10-CM | POA: Diagnosis not present

## 2013-08-30 DIAGNOSIS — H18519 Endothelial corneal dystrophy, unspecified eye: Secondary | ICD-10-CM | POA: Diagnosis not present

## 2013-08-30 DIAGNOSIS — E1139 Type 2 diabetes mellitus with other diabetic ophthalmic complication: Secondary | ICD-10-CM | POA: Diagnosis not present

## 2013-08-30 DIAGNOSIS — R109 Unspecified abdominal pain: Secondary | ICD-10-CM | POA: Diagnosis not present

## 2013-08-30 DIAGNOSIS — I10 Essential (primary) hypertension: Secondary | ICD-10-CM | POA: Diagnosis not present

## 2013-08-30 DIAGNOSIS — F17200 Nicotine dependence, unspecified, uncomplicated: Secondary | ICD-10-CM | POA: Diagnosis not present

## 2013-08-30 DIAGNOSIS — E1149 Type 2 diabetes mellitus with other diabetic neurological complication: Secondary | ICD-10-CM | POA: Diagnosis not present

## 2013-10-15 DIAGNOSIS — I10 Essential (primary) hypertension: Secondary | ICD-10-CM | POA: Diagnosis not present

## 2013-10-15 DIAGNOSIS — Z124 Encounter for screening for malignant neoplasm of cervix: Secondary | ICD-10-CM | POA: Diagnosis not present

## 2013-10-15 DIAGNOSIS — Z0000 Encounter for general adult medical examination without abnormal findings: Secondary | ICD-10-CM | POA: Diagnosis not present

## 2013-10-15 DIAGNOSIS — Z1239 Encounter for other screening for malignant neoplasm of breast: Secondary | ICD-10-CM | POA: Diagnosis not present

## 2013-10-15 DIAGNOSIS — IMO0001 Reserved for inherently not codable concepts without codable children: Secondary | ICD-10-CM | POA: Diagnosis not present

## 2013-10-23 ENCOUNTER — Other Ambulatory Visit (HOSPITAL_COMMUNITY)
Admission: RE | Admit: 2013-10-23 | Discharge: 2013-10-23 | Disposition: A | Discharge status: Home / Self Care | Payer: Medicare Other | Source: Ambulatory Visit | Attending: Obstetrics & Gynecology | Admitting: Obstetrics & Gynecology

## 2013-10-23 ENCOUNTER — Other Ambulatory Visit: Payer: Self-pay | Admitting: Obstetrics & Gynecology

## 2013-10-23 DIAGNOSIS — Z124 Encounter for screening for malignant neoplasm of cervix: Secondary | ICD-10-CM | POA: Diagnosis not present

## 2013-10-23 DIAGNOSIS — R32 Unspecified urinary incontinence: Secondary | ICD-10-CM | POA: Diagnosis not present

## 2013-10-23 DIAGNOSIS — Z1151 Encounter for screening for human papillomavirus (HPV): Secondary | ICD-10-CM | POA: Insufficient documentation

## 2013-10-23 DIAGNOSIS — N949 Unspecified condition associated with female genital organs and menstrual cycle: Secondary | ICD-10-CM | POA: Diagnosis not present

## 2013-10-23 DIAGNOSIS — Z01419 Encounter for gynecological examination (general) (routine) without abnormal findings: Secondary | ICD-10-CM | POA: Diagnosis not present

## 2013-10-23 DIAGNOSIS — N819 Female genital prolapse, unspecified: Secondary | ICD-10-CM | POA: Diagnosis not present

## 2013-10-26 DIAGNOSIS — Z1231 Encounter for screening mammogram for malignant neoplasm of breast: Secondary | ICD-10-CM | POA: Diagnosis not present

## 2013-11-05 DIAGNOSIS — N819 Female genital prolapse, unspecified: Secondary | ICD-10-CM | POA: Diagnosis not present

## 2013-11-05 DIAGNOSIS — N949 Unspecified condition associated with female genital organs and menstrual cycle: Secondary | ICD-10-CM | POA: Diagnosis not present

## 2013-11-09 ENCOUNTER — Encounter (HOSPITAL_COMMUNITY): Payer: Self-pay

## 2013-11-12 DIAGNOSIS — R928 Other abnormal and inconclusive findings on diagnostic imaging of breast: Secondary | ICD-10-CM | POA: Diagnosis not present

## 2013-11-12 DIAGNOSIS — N644 Mastodynia: Secondary | ICD-10-CM | POA: Diagnosis not present

## 2013-11-13 DIAGNOSIS — I10 Essential (primary) hypertension: Secondary | ICD-10-CM | POA: Diagnosis not present

## 2013-11-13 DIAGNOSIS — E1139 Type 2 diabetes mellitus with other diabetic ophthalmic complication: Secondary | ICD-10-CM | POA: Diagnosis not present

## 2013-11-19 ENCOUNTER — Ambulatory Visit (HOSPITAL_COMMUNITY)
Admission: RE | Admit: 2013-11-19 | Payer: Medicare Other | Source: Ambulatory Visit | Admitting: Obstetrics & Gynecology

## 2013-11-19 ENCOUNTER — Encounter (HOSPITAL_COMMUNITY): Admission: RE | Payer: Self-pay | Source: Ambulatory Visit

## 2013-11-19 SURGERY — HYSTERECTOMY, VAGINAL
Anesthesia: Choice

## 2013-12-31 DIAGNOSIS — R209 Unspecified disturbances of skin sensation: Secondary | ICD-10-CM | POA: Diagnosis not present

## 2013-12-31 DIAGNOSIS — E1149 Type 2 diabetes mellitus with other diabetic neurological complication: Secondary | ICD-10-CM | POA: Diagnosis not present

## 2014-01-16 ENCOUNTER — Ambulatory Visit: Payer: TRICARE For Life (TFL) | Admitting: Nurse Practitioner

## 2014-04-02 DIAGNOSIS — J209 Acute bronchitis, unspecified: Secondary | ICD-10-CM | POA: Diagnosis not present

## 2014-04-02 DIAGNOSIS — K5900 Constipation, unspecified: Secondary | ICD-10-CM | POA: Diagnosis not present

## 2014-04-08 DIAGNOSIS — E1165 Type 2 diabetes mellitus with hyperglycemia: Secondary | ICD-10-CM | POA: Diagnosis not present

## 2014-04-08 DIAGNOSIS — E1149 Type 2 diabetes mellitus with other diabetic neurological complication: Secondary | ICD-10-CM | POA: Diagnosis not present

## 2014-04-08 DIAGNOSIS — E785 Hyperlipidemia, unspecified: Secondary | ICD-10-CM | POA: Diagnosis not present

## 2014-04-08 DIAGNOSIS — I10 Essential (primary) hypertension: Secondary | ICD-10-CM | POA: Diagnosis not present

## 2014-04-08 DIAGNOSIS — K219 Gastro-esophageal reflux disease without esophagitis: Secondary | ICD-10-CM | POA: Diagnosis not present

## 2014-04-08 DIAGNOSIS — R209 Unspecified disturbances of skin sensation: Secondary | ICD-10-CM | POA: Diagnosis not present

## 2014-04-08 DIAGNOSIS — E1139 Type 2 diabetes mellitus with other diabetic ophthalmic complication: Secondary | ICD-10-CM | POA: Diagnosis not present

## 2014-05-17 DIAGNOSIS — Z79899 Other long term (current) drug therapy: Secondary | ICD-10-CM | POA: Diagnosis not present

## 2014-05-17 DIAGNOSIS — IMO0002 Reserved for concepts with insufficient information to code with codable children: Secondary | ICD-10-CM | POA: Diagnosis not present

## 2014-05-17 DIAGNOSIS — K219 Gastro-esophageal reflux disease without esophagitis: Secondary | ICD-10-CM | POA: Diagnosis not present

## 2014-05-17 DIAGNOSIS — I10 Essential (primary) hypertension: Secondary | ICD-10-CM | POA: Diagnosis not present

## 2014-05-17 DIAGNOSIS — E1165 Type 2 diabetes mellitus with hyperglycemia: Secondary | ICD-10-CM | POA: Diagnosis not present

## 2014-05-17 DIAGNOSIS — F17200 Nicotine dependence, unspecified, uncomplicated: Secondary | ICD-10-CM | POA: Diagnosis not present

## 2014-05-17 DIAGNOSIS — E119 Type 2 diabetes mellitus without complications: Secondary | ICD-10-CM | POA: Diagnosis not present

## 2014-05-17 DIAGNOSIS — R109 Unspecified abdominal pain: Secondary | ICD-10-CM | POA: Diagnosis not present

## 2014-05-17 DIAGNOSIS — K7689 Other specified diseases of liver: Secondary | ICD-10-CM | POA: Diagnosis not present

## 2014-05-17 DIAGNOSIS — R935 Abnormal findings on diagnostic imaging of other abdominal regions, including retroperitoneum: Secondary | ICD-10-CM | POA: Diagnosis not present

## 2014-05-17 DIAGNOSIS — E1169 Type 2 diabetes mellitus with other specified complication: Secondary | ICD-10-CM | POA: Diagnosis not present

## 2014-05-17 DIAGNOSIS — E039 Hypothyroidism, unspecified: Secondary | ICD-10-CM | POA: Diagnosis not present

## 2014-05-18 DIAGNOSIS — I10 Essential (primary) hypertension: Secondary | ICD-10-CM | POA: Diagnosis not present

## 2014-05-18 DIAGNOSIS — K219 Gastro-esophageal reflux disease without esophagitis: Secondary | ICD-10-CM | POA: Diagnosis not present

## 2014-05-18 DIAGNOSIS — IMO0002 Reserved for concepts with insufficient information to code with codable children: Secondary | ICD-10-CM | POA: Diagnosis not present

## 2014-05-18 DIAGNOSIS — E039 Hypothyroidism, unspecified: Secondary | ICD-10-CM | POA: Diagnosis not present

## 2014-05-18 DIAGNOSIS — K5900 Constipation, unspecified: Secondary | ICD-10-CM | POA: Diagnosis not present

## 2014-05-18 DIAGNOSIS — E1165 Type 2 diabetes mellitus with hyperglycemia: Secondary | ICD-10-CM | POA: Diagnosis not present

## 2014-05-18 DIAGNOSIS — E7800 Pure hypercholesterolemia, unspecified: Secondary | ICD-10-CM | POA: Diagnosis not present

## 2014-05-18 DIAGNOSIS — Z79899 Other long term (current) drug therapy: Secondary | ICD-10-CM | POA: Diagnosis not present

## 2014-05-18 DIAGNOSIS — F17200 Nicotine dependence, unspecified, uncomplicated: Secondary | ICD-10-CM | POA: Diagnosis not present

## 2014-05-20 DIAGNOSIS — Z1211 Encounter for screening for malignant neoplasm of colon: Secondary | ICD-10-CM | POA: Diagnosis not present

## 2014-05-20 DIAGNOSIS — L301 Dyshidrosis [pompholyx]: Secondary | ICD-10-CM | POA: Diagnosis not present

## 2014-05-20 DIAGNOSIS — IMO0001 Reserved for inherently not codable concepts without codable children: Secondary | ICD-10-CM | POA: Diagnosis not present

## 2014-06-04 DIAGNOSIS — E1149 Type 2 diabetes mellitus with other diabetic neurological complication: Secondary | ICD-10-CM | POA: Diagnosis not present

## 2014-06-18 DIAGNOSIS — E1149 Type 2 diabetes mellitus with other diabetic neurological complication: Secondary | ICD-10-CM | POA: Diagnosis not present

## 2014-06-18 DIAGNOSIS — K5900 Constipation, unspecified: Secondary | ICD-10-CM | POA: Diagnosis not present

## 2014-08-19 DIAGNOSIS — E1142 Type 2 diabetes mellitus with diabetic polyneuropathy: Secondary | ICD-10-CM | POA: Diagnosis not present

## 2014-08-19 DIAGNOSIS — E1140 Type 2 diabetes mellitus with diabetic neuropathy, unspecified: Secondary | ICD-10-CM | POA: Diagnosis not present

## 2014-08-19 DIAGNOSIS — E11319 Type 2 diabetes mellitus with unspecified diabetic retinopathy without macular edema: Secondary | ICD-10-CM | POA: Diagnosis not present

## 2014-08-19 DIAGNOSIS — Z23 Encounter for immunization: Secondary | ICD-10-CM | POA: Diagnosis not present

## 2014-08-19 DIAGNOSIS — E1165 Type 2 diabetes mellitus with hyperglycemia: Secondary | ICD-10-CM | POA: Diagnosis not present

## 2014-10-10 DIAGNOSIS — I10 Essential (primary) hypertension: Secondary | ICD-10-CM | POA: Diagnosis not present

## 2014-10-10 DIAGNOSIS — E1140 Type 2 diabetes mellitus with diabetic neuropathy, unspecified: Secondary | ICD-10-CM | POA: Diagnosis not present

## 2014-10-10 DIAGNOSIS — E11319 Type 2 diabetes mellitus with unspecified diabetic retinopathy without macular edema: Secondary | ICD-10-CM | POA: Diagnosis not present

## 2014-10-10 DIAGNOSIS — E782 Mixed hyperlipidemia: Secondary | ICD-10-CM | POA: Diagnosis not present

## 2014-10-10 DIAGNOSIS — E039 Hypothyroidism, unspecified: Secondary | ICD-10-CM | POA: Diagnosis not present

## 2014-10-10 DIAGNOSIS — K219 Gastro-esophageal reflux disease without esophagitis: Secondary | ICD-10-CM | POA: Diagnosis not present

## 2014-10-10 DIAGNOSIS — E1165 Type 2 diabetes mellitus with hyperglycemia: Secondary | ICD-10-CM | POA: Diagnosis not present

## 2014-10-10 DIAGNOSIS — E1142 Type 2 diabetes mellitus with diabetic polyneuropathy: Secondary | ICD-10-CM | POA: Diagnosis not present

## 2014-10-27 ENCOUNTER — Encounter (HOSPITAL_COMMUNITY): Payer: Self-pay | Admitting: Emergency Medicine

## 2014-10-27 ENCOUNTER — Emergency Department (HOSPITAL_COMMUNITY)
Admission: EM | Admit: 2014-10-27 | Discharge: 2014-10-27 | Disposition: A | Discharge status: Home / Self Care | Payer: Medicare Other | Attending: Emergency Medicine | Admitting: Emergency Medicine

## 2014-10-27 DIAGNOSIS — G43909 Migraine, unspecified, not intractable, without status migrainosus: Secondary | ICD-10-CM | POA: Insufficient documentation

## 2014-10-27 DIAGNOSIS — Z720 Tobacco use: Secondary | ICD-10-CM | POA: Diagnosis not present

## 2014-10-27 DIAGNOSIS — Z85858 Personal history of malignant neoplasm of other endocrine glands: Secondary | ICD-10-CM | POA: Insufficient documentation

## 2014-10-27 DIAGNOSIS — Z794 Long term (current) use of insulin: Secondary | ICD-10-CM | POA: Insufficient documentation

## 2014-10-27 DIAGNOSIS — Z79899 Other long term (current) drug therapy: Secondary | ICD-10-CM | POA: Diagnosis not present

## 2014-10-27 DIAGNOSIS — R05 Cough: Secondary | ICD-10-CM | POA: Diagnosis present

## 2014-10-27 DIAGNOSIS — J069 Acute upper respiratory infection, unspecified: Secondary | ICD-10-CM

## 2014-10-27 DIAGNOSIS — I10 Essential (primary) hypertension: Secondary | ICD-10-CM | POA: Insufficient documentation

## 2014-10-27 DIAGNOSIS — G629 Polyneuropathy, unspecified: Secondary | ICD-10-CM | POA: Diagnosis not present

## 2014-10-27 DIAGNOSIS — E119 Type 2 diabetes mellitus without complications: Secondary | ICD-10-CM | POA: Diagnosis not present

## 2014-10-27 LAB — CBC WITH DIFFERENTIAL/PLATELET
BASOS PCT: 0 % (ref 0–1)
Basophils Absolute: 0 10*3/uL (ref 0.0–0.1)
EOS ABS: 0.3 10*3/uL (ref 0.0–0.7)
Eosinophils Relative: 3 % (ref 0–5)
HEMATOCRIT: 41.2 % (ref 36.0–46.0)
HEMOGLOBIN: 13.4 g/dL (ref 12.0–15.0)
LYMPHS ABS: 3.3 10*3/uL (ref 0.7–4.0)
Lymphocytes Relative: 32 % (ref 12–46)
MCH: 28.7 pg (ref 26.0–34.0)
MCHC: 32.5 g/dL (ref 30.0–36.0)
MCV: 88.2 fL (ref 78.0–100.0)
MONO ABS: 0.4 10*3/uL (ref 0.1–1.0)
MONOS PCT: 4 % (ref 3–12)
Neutro Abs: 6.2 10*3/uL (ref 1.7–7.7)
Neutrophils Relative %: 61 % (ref 43–77)
Platelets: 215 10*3/uL (ref 150–400)
RBC: 4.67 MIL/uL (ref 3.87–5.11)
RDW: 13.1 % (ref 11.5–15.5)
WBC: 10.2 10*3/uL (ref 4.0–10.5)

## 2014-10-27 LAB — BASIC METABOLIC PANEL
Anion gap: 9 (ref 5–15)
BUN: 11 mg/dL (ref 6–23)
CHLORIDE: 101 meq/L (ref 96–112)
CO2: 26 mmol/L (ref 19–32)
Calcium: 9.4 mg/dL (ref 8.4–10.5)
Creatinine, Ser: 0.79 mg/dL (ref 0.50–1.10)
GFR calc Af Amer: >90 mL/min (ref >90)
GFR calc non Af Amer: >90 mL/min (ref >90)
Glucose, Bld: 164 mg/dL — ABNORMAL HIGH (ref 70–99)
Potassium: 3.5 mmol/L (ref 3.5–5.1)
Sodium: 136 mmol/L (ref 135–145)

## 2014-10-27 MED ORDER — GUAIFENESIN-DM 100-10 MG/5ML PO SYRP
5.0000 mL | ORAL_SOLUTION | Freq: Once | ORAL | Status: AC
  Administered 2014-10-27: 5 mL via ORAL
  Filled 2014-10-27: qty 5

## 2014-10-27 NOTE — ED Provider Notes (Signed)
CSN: [id number]     Arrival date & time 10/27/14  1723 History  This chart was scribed for Richarda Blade, MD by Randa Evens, ED Scribe. This patient was seen in room APA01/APA01 and the patient's care was started at 5:38 PM.      Chief Complaint  Patient presents with  . Cough   The history is provided by the patient. No language interpreter was used.   HPI Comments: Kelsey Conley is a 59 y.o. female who presents to the Emergency Department complaining of non productive progressively worsening cough onset December 2015. Pt states he has had associated congestion, sore throat and chest soreness brought on from coughing. Pt states she has tried OTC medications with no relief. Pt states she is an everyday smoker. Denies fever or other related symptoms.   Past Medical History  Diagnosis Date  . Diabetes mellitus without complication   . Hypertension   . Cancer     lymph node right neck  . Neuromuscular disorder     neuropathy  . Migraines    Past Surgical History  Procedure Laterality Date  . Cholecystectomy    . Lower back surgery      L4, L5, L3  . Tubaligation    . Neck surgery     Family History  Problem Relation Age of Onset  . Migraines Mother   . Crohn's disease Mother   . Hypertension Mother   . Stroke Father   . Migraines Sister   . Diabetes Sister   . Hypertension Sister   . Thyroid disease Sister   . Cancer Maternal Aunt   . Cancer Maternal Uncle   . Cancer Paternal Uncle     brain, lung, liver, stomach  . Cancer Maternal Grandmother   . Diabetes Maternal Grandmother   . Cancer Maternal Grandfather     prostate   History  Substance Use Topics  . Smoking status: Current Every Day Smoker -- 1.00 packs/day    Types: Cigarettes  . Smokeless tobacco: Not on file  . Alcohol Use: No   OB History    No data available     Review of Systems  HENT: Positive for congestion and sore throat.   Respiratory: Positive for cough.   Cardiovascular: Positive for  chest pain (chest soreness.).  All other systems reviewed and are negative.    Allergies  Aspirin and Codeine  Home Medications   Prior to Admission medications   Medication Sig Start Date End Date Taking? Authorizing Provider  Camphor-Eucalyptus-Menthol (VICKS VAPORUB) 4.73-1.2-2.6 % OINT Apply 1 application topically daily as needed (for cold and cough).   Yes Historical Provider, MD  DULoxetine (CYMBALTA) 30 MG capsule Take 30 mg by mouth 2 (two) times daily.   Yes Historical Provider, MD  esomeprazole (NEXIUM) 40 MG capsule Take 40 mg by mouth 2 (two) times daily.   Yes Historical Provider, MD  gabapentin (NEURONTIN) 600 MG tablet Take 1,200 mg by mouth 2 (two) times daily.    Yes Historical Provider, MD  glimepiride (AMARYL) 4 MG tablet Take 4 mg by mouth 2 (two) times daily.   Yes Historical Provider, MD  guaiFENesin (ROBITUSSIN) 100 MG/5ML SOLN Take 15 mLs by mouth every 4 (four) hours as needed for cough or to loosen phlegm.   Yes Historical Provider, MD  insulin detemir (LEVEMIR) 100 UNIT/ML injection Inject 50 Units into the skin daily.   Yes Historical Provider, MD  levothyroxine (SYNTHROID, LEVOTHROID) 175 MCG tablet Take 175 mcg  by mouth daily before breakfast.   Yes Historical Provider, MD  menthol-cetylpyridinium (CEPACOL) 3 MG lozenge Take 1 lozenge by mouth as needed for sore throat.   Yes Historical Provider, MD  metFORMIN (GLUCOPHAGE) 1000 MG tablet Take 1,000 mg by mouth 2 (two) times daily with a meal.   Yes Historical Provider, MD  Phenylephrine-Pheniramine-DM (THERAFLU COLD & COUGH) 08-14-19 MG PACK Take 1 packet by mouth daily as needed (for cough and congestion).   Yes Historical Provider, MD  pravastatin (PRAVACHOL) 40 MG tablet Take 40 mg by mouth at bedtime.   Yes Historical Provider, MD  Pseudoeph-Doxylamine-DM-APAP (NYQUIL PO) Take 15-30 mLs by mouth daily as needed (for cough).   Yes Historical Provider, MD  pseudoephedrine (SUDAFED) 30 MG tablet Take 30 mg by  mouth every 4 (four) hours as needed for congestion.   Yes Historical Provider, MD  sitaGLIPtin (JANUVIA) 100 MG tablet Take 100 mg by mouth daily.   Yes Historical Provider, MD  valsartan-hydrochlorothiazide (DIOVAN-HCT) 160-12.5 MG per tablet Take 1 tablet by mouth daily.   Yes Historical Provider, MD   Triage Vital: BP 119/44 mmHg  Pulse 73  Temp(Src) 97.9 F (36.6 C) (Oral)  Resp 20  Ht 5\' 8"  (1.727 m)  Wt 200 lb (90.719 kg)  BMI 30.42 kg/m2  SpO2 97%  Physical Exam  Constitutional: She is oriented to person, place, and time. She appears well-developed and well-nourished.  HENT:  Head: Normocephalic and atraumatic.  Eyes: Conjunctivae and EOM are normal. Pupils are equal, round, and reactive to light.  Neck: Normal range of motion and phonation normal. Neck supple.  Cardiovascular: Normal rate and regular rhythm.   Pulmonary/Chest: Breath sounds normal. Tachypnea noted. She has no wheezes. She has no rhonchi. She has no rales. She exhibits no tenderness.  Coughing   Abdominal: Soft. She exhibits no distension. There is no tenderness. There is no guarding.  Musculoskeletal: Normal range of motion.  Neurological: She is alert and oriented to person, place, and time. She exhibits normal muscle tone.  Skin: Skin is warm and dry.  Psychiatric: She has a normal mood and affect. Her behavior is normal. Judgment and thought content normal.  Nursing note and vitals reviewed.   ED Course  Procedures (including critical care time) DIAGNOSTIC STUDIES: Oxygen Saturation is 97% on RA, normal by my interpretation.    COORDINATION OF CARE: 5:46 PM-Discussed treatment plan with pt at bedside and pt agreed to plan.  7:20 PM- Recheck with pt and dicussed that test results show no acute abnormalities     Labs Review Labs Reviewed  BASIC METABOLIC PANEL - Abnormal; Notable for the following:    Glucose, Bld 164 (*)    All other components within normal limits  CBC WITH DIFFERENTIAL     Imaging Review No results found.   EKG Interpretation None      MDM   Final diagnoses:  URI (upper respiratory infection)   Eval. C/w URI. Doubt PNE, SBI or Metabolic instability.   Nursing Notes Reviewed/ Care Coordinated Applicable Imaging Reviewed Interpretation of Laboratory Data incorporated into ED treatment  The patient appears reasonably screened and/or stabilized for discharge and I doubt any other medical condition or other [facility] requiring further screening, evaluation, or treatment in the ED at this time prior to discharge.  Plan: Home Medications- OTC symptomatic treatments; Home Treatments- rest, fluids; return here if the recommended treatment, does not improve the symptoms; Recommended follow up- PCP prn   I personally performed the services  described in this documentation, which was scribed in my presence. The recorded information has been reviewed and is accurate.      Richarda Blade, MD 10/30/14 1153

## 2014-10-27 NOTE — ED Notes (Signed)
Patient states cough and shortness of breath that started 10/10/14. Patient denies fever, and denies productive cough. Patient states the cough and shortness of breath has gotten worse over the past "few days" A&OX4 at this time.

## 2014-10-27 NOTE — ED Notes (Signed)
Patient and family ambulatory out of department at this time prior to discharge instructions being given to patient.

## 2014-10-27 NOTE — Discharge Instructions (Signed)
Use Robitussin-DM, 3-4 times a day for cough. Get plenty of rest, and drink a lot of fluids. Use ibuprofen, or acetaminophen for pain and discomfort.    Upper Respiratory Infection, Adult An upper respiratory infection (URI) is also sometimes known as the common cold. The upper respiratory tract includes the nose, sinuses, throat, trachea, and bronchi. Bronchi are the airways leading to the lungs. Most people improve within 1 week, but symptoms can last up to 2 weeks. A residual cough may last even longer.  CAUSES Many different viruses can infect the tissues lining the upper respiratory tract. The tissues become irritated and inflamed and often become very moist. Mucus production is also common. A cold is contagious. You can easily spread the virus to others by oral contact. This includes kissing, sharing a glass, coughing, or sneezing. Touching your mouth or nose and then touching a surface, which is then touched by another person, can also spread the virus. SYMPTOMS  Symptoms typically develop 1 to 3 days after you come in contact with a cold virus. Symptoms vary from person to person. They may include:  Runny nose.  Sneezing.  Nasal congestion.  Sinus irritation.  Sore throat.  Loss of voice (laryngitis).  Cough.  Fatigue.  Muscle aches.  Loss of appetite.  Headache.  Low-grade fever. DIAGNOSIS  You might diagnose your own cold based on familiar symptoms, since most people get a cold 2 to 3 times a year. Your caregiver can confirm this based on your exam. Most importantly, your caregiver can check that your symptoms are not due to another disease such as strep throat, sinusitis, pneumonia, asthma, or epiglottitis. Blood tests, throat tests, and X-rays are not necessary to diagnose a common cold, but they may sometimes be helpful in excluding other more serious diseases. Your caregiver will decide if any further tests are required. RISKS AND COMPLICATIONS  You may be at risk  for a more severe case of the common cold if you smoke cigarettes, have chronic heart disease (such as heart failure) or lung disease (such as asthma), or if you have a weakened immune system. The very young and very old are also at risk for more serious infections. Bacterial sinusitis, middle ear infections, and bacterial pneumonia can complicate the common cold. The common cold can worsen asthma and chronic obstructive pulmonary disease (COPD). Sometimes, these complications can require emergency medical care and may be life-threatening. PREVENTION  The best way to protect against getting a cold is to practice good hygiene. Avoid oral or hand contact with people with cold symptoms. Wash your hands often if contact occurs. There is no clear evidence that vitamin C, vitamin E, echinacea, or exercise reduces the chance of developing a cold. However, it is always recommended to get plenty of rest and practice good nutrition. TREATMENT  Treatment is directed at relieving symptoms. There is no cure. Antibiotics are not effective, because the infection is caused by a virus, not by bacteria. Treatment may include:  Increased fluid intake. Sports drinks offer valuable electrolytes, sugars, and fluids.  Breathing heated mist or steam (vaporizer or shower).  Eating chicken soup or other clear broths, and maintaining good nutrition.  Getting plenty of rest.  Using gargles or lozenges for comfort.  Controlling fevers with ibuprofen or acetaminophen as directed by your caregiver.  Increasing usage of your inhaler if you have asthma. Zinc gel and zinc lozenges, taken in the first 24 hours of the common cold, can shorten the duration and lessen the  severity of symptoms. Pain medicines may help with fever, muscle aches, and throat pain. A variety of non-prescription medicines are available to treat congestion and runny nose. Your caregiver can make recommendations and may suggest nasal or lung inhalers for other  symptoms.  HOME CARE INSTRUCTIONS   Only take over-the-counter or prescription medicines for pain, discomfort, or fever as directed by your caregiver.  Use a warm mist humidifier or inhale steam from a shower to increase air moisture. This may keep secretions moist and make it easier to breathe.  Drink enough water and fluids to keep your urine clear or pale yellow.  Rest as needed.  Return to work when your temperature has returned to normal or as your caregiver advises. You may need to stay home longer to avoid infecting others. You can also use a face mask and careful hand washing to prevent spread of the virus. SEEK MEDICAL CARE IF:   After the first few days, you feel you are getting worse rather than better.  You need your caregiver's advice about medicines to control symptoms.  You develop chills, worsening shortness of breath, or brown or red sputum. These may be signs of pneumonia.  You develop yellow or brown nasal discharge or pain in the face, especially when you bend forward. These may be signs of sinusitis.  You develop a fever, swollen neck glands, pain with swallowing, or white areas in the back of your throat. These may be signs of strep throat. SEEK IMMEDIATE MEDICAL CARE IF:   You have a fever.  You develop severe or persistent headache, ear pain, sinus pain, or chest pain.  You develop wheezing, a prolonged cough, cough up blood, or have a change in your usual mucus (if you have chronic lung disease).  You develop sore muscles or a stiff neck. Document Released: 04/06/2001 Document Revised: 01/03/2012 Document Reviewed: 01/16/2014 [facility] Patient Information 2015 [place], [place]. This information is not intended to replace advice given to you by your health care provider. Make sure you discuss any questions you have with your health care provider.

## 2014-10-27 NOTE — ED Notes (Signed)
Pt reports congestion,cough, chills,generalized body aches for last several weeks. Mild dyspnea noted with exertion.

## 2014-10-27 NOTE — ED Notes (Signed)
Patient states she has relief from cough at this time.

## 2014-10-27 NOTE — ED Notes (Signed)
Patient ambulatory to and from restroom unassisted at this time. No needs voiced.

## 2014-10-27 NOTE — ED Notes (Signed)
Patient ambulatory out of department at this time with family. Patient left ED prior to discharge instructions, and prior to signing out.

## 2014-10-30 DIAGNOSIS — J111 Influenza due to unidentified influenza virus with other respiratory manifestations: Secondary | ICD-10-CM | POA: Diagnosis not present

## 2014-10-30 DIAGNOSIS — J0101 Acute recurrent maxillary sinusitis: Secondary | ICD-10-CM | POA: Diagnosis not present

## 2014-10-30 DIAGNOSIS — J069 Acute upper respiratory infection, unspecified: Secondary | ICD-10-CM | POA: Diagnosis not present

## 2014-11-06 DIAGNOSIS — Z1231 Encounter for screening mammogram for malignant neoplasm of breast: Secondary | ICD-10-CM | POA: Diagnosis not present

## 2014-11-06 DIAGNOSIS — Z803 Family history of malignant neoplasm of breast: Secondary | ICD-10-CM | POA: Diagnosis not present

## 2015-01-21 DIAGNOSIS — R159 Full incontinence of feces: Secondary | ICD-10-CM | POA: Diagnosis not present

## 2015-01-21 DIAGNOSIS — E1165 Type 2 diabetes mellitus with hyperglycemia: Secondary | ICD-10-CM | POA: Diagnosis not present

## 2015-01-21 DIAGNOSIS — Z720 Tobacco use: Secondary | ICD-10-CM | POA: Diagnosis not present

## 2015-01-21 DIAGNOSIS — N3946 Mixed incontinence: Secondary | ICD-10-CM | POA: Diagnosis not present

## 2015-01-21 DIAGNOSIS — I10 Essential (primary) hypertension: Secondary | ICD-10-CM | POA: Diagnosis not present

## 2015-01-21 DIAGNOSIS — N816 Rectocele: Secondary | ICD-10-CM | POA: Diagnosis not present

## 2015-01-21 DIAGNOSIS — N812 Incomplete uterovaginal prolapse: Secondary | ICD-10-CM | POA: Diagnosis not present

## 2015-02-03 DIAGNOSIS — M25572 Pain in left ankle and joints of left foot: Secondary | ICD-10-CM | POA: Diagnosis not present

## 2015-02-03 DIAGNOSIS — R51 Headache: Secondary | ICD-10-CM | POA: Diagnosis not present

## 2015-02-03 DIAGNOSIS — I10 Essential (primary) hypertension: Secondary | ICD-10-CM | POA: Diagnosis not present

## 2015-03-12 ENCOUNTER — Other Ambulatory Visit: Payer: Self-pay | Admitting: Obstetrics and Gynecology

## 2015-03-12 DIAGNOSIS — N816 Rectocele: Secondary | ICD-10-CM | POA: Diagnosis not present

## 2015-03-12 DIAGNOSIS — N393 Stress incontinence (female) (male): Secondary | ICD-10-CM | POA: Diagnosis not present

## 2015-03-12 DIAGNOSIS — R109 Unspecified abdominal pain: Secondary | ICD-10-CM | POA: Diagnosis not present

## 2015-03-12 DIAGNOSIS — F17200 Nicotine dependence, unspecified, uncomplicated: Secondary | ICD-10-CM | POA: Diagnosis not present

## 2015-03-12 DIAGNOSIS — K5902 Outlet dysfunction constipation: Secondary | ICD-10-CM | POA: Diagnosis not present

## 2015-03-12 DIAGNOSIS — R1084 Generalized abdominal pain: Secondary | ICD-10-CM

## 2015-03-12 DIAGNOSIS — N814 Uterovaginal prolapse, unspecified: Secondary | ICD-10-CM | POA: Diagnosis not present

## 2015-03-18 ENCOUNTER — Ambulatory Visit
Admission: RE | Admit: 2015-03-18 | Discharge: 2015-03-18 | Disposition: A | Discharge status: Home / Self Care | Payer: Medicare Other | Source: Ambulatory Visit | Attending: Obstetrics and Gynecology | Admitting: Obstetrics and Gynecology

## 2015-03-18 DIAGNOSIS — Z794 Long term (current) use of insulin: Secondary | ICD-10-CM | POA: Diagnosis not present

## 2015-03-18 DIAGNOSIS — I10 Essential (primary) hypertension: Secondary | ICD-10-CM | POA: Diagnosis not present

## 2015-03-18 DIAGNOSIS — R140 Abdominal distension (gaseous): Secondary | ICD-10-CM | POA: Diagnosis not present

## 2015-03-18 DIAGNOSIS — R1084 Generalized abdominal pain: Secondary | ICD-10-CM | POA: Diagnosis not present

## 2015-03-18 DIAGNOSIS — K3184 Gastroparesis: Secondary | ICD-10-CM | POA: Diagnosis not present

## 2015-03-18 DIAGNOSIS — Z8541 Personal history of malignant neoplasm of cervix uteri: Secondary | ICD-10-CM | POA: Diagnosis not present

## 2015-03-18 DIAGNOSIS — E780 Pure hypercholesterolemia: Secondary | ICD-10-CM | POA: Diagnosis not present

## 2015-03-18 DIAGNOSIS — R109 Unspecified abdominal pain: Secondary | ICD-10-CM | POA: Diagnosis not present

## 2015-03-18 DIAGNOSIS — K219 Gastro-esophageal reflux disease without esophagitis: Secondary | ICD-10-CM | POA: Diagnosis not present

## 2015-03-18 DIAGNOSIS — E039 Hypothyroidism, unspecified: Secondary | ICD-10-CM | POA: Diagnosis not present

## 2015-03-18 DIAGNOSIS — K760 Fatty (change of) liver, not elsewhere classified: Secondary | ICD-10-CM | POA: Diagnosis not present

## 2015-03-18 DIAGNOSIS — Z79899 Other long term (current) drug therapy: Secondary | ICD-10-CM | POA: Diagnosis not present

## 2015-03-18 DIAGNOSIS — F17200 Nicotine dependence, unspecified, uncomplicated: Secondary | ICD-10-CM | POA: Diagnosis not present

## 2015-03-18 DIAGNOSIS — E1143 Type 2 diabetes mellitus with diabetic autonomic (poly)neuropathy: Secondary | ICD-10-CM | POA: Diagnosis not present

## 2015-03-18 DIAGNOSIS — R110 Nausea: Secondary | ICD-10-CM | POA: Diagnosis not present

## 2015-03-18 MED ORDER — IOHEXOL 300 MG/ML  SOLN
100.0000 mL | Freq: Once | INTRAMUSCULAR | Status: AC | PRN
  Administered 2015-03-18: 100 mL via INTRAVENOUS

## 2015-03-19 DIAGNOSIS — R140 Abdominal distension (gaseous): Secondary | ICD-10-CM | POA: Diagnosis not present

## 2015-04-01 DIAGNOSIS — H5203 Hypermetropia, bilateral: Secondary | ICD-10-CM | POA: Diagnosis not present

## 2015-04-01 DIAGNOSIS — E119 Type 2 diabetes mellitus without complications: Secondary | ICD-10-CM | POA: Diagnosis not present

## 2015-04-01 DIAGNOSIS — H52223 Regular astigmatism, bilateral: Secondary | ICD-10-CM | POA: Diagnosis not present

## 2015-04-01 DIAGNOSIS — H524 Presbyopia: Secondary | ICD-10-CM | POA: Diagnosis not present

## 2015-04-02 DIAGNOSIS — K5901 Slow transit constipation: Secondary | ICD-10-CM | POA: Diagnosis not present

## 2015-04-02 DIAGNOSIS — N393 Stress incontinence (female) (male): Secondary | ICD-10-CM | POA: Diagnosis not present

## 2015-04-02 DIAGNOSIS — K3184 Gastroparesis: Secondary | ICD-10-CM | POA: Diagnosis not present

## 2015-04-02 DIAGNOSIS — N816 Rectocele: Secondary | ICD-10-CM | POA: Diagnosis not present

## 2015-04-16 DIAGNOSIS — Z1389 Encounter for screening for other disorder: Secondary | ICD-10-CM | POA: Diagnosis not present

## 2015-04-16 DIAGNOSIS — I10 Essential (primary) hypertension: Secondary | ICD-10-CM | POA: Diagnosis not present

## 2015-04-16 DIAGNOSIS — Z0000 Encounter for general adult medical examination without abnormal findings: Secondary | ICD-10-CM | POA: Diagnosis not present

## 2015-04-16 DIAGNOSIS — Z794 Long term (current) use of insulin: Secondary | ICD-10-CM | POA: Diagnosis not present

## 2015-04-16 DIAGNOSIS — E039 Hypothyroidism, unspecified: Secondary | ICD-10-CM | POA: Diagnosis not present

## 2015-04-16 DIAGNOSIS — E11319 Type 2 diabetes mellitus with unspecified diabetic retinopathy without macular edema: Secondary | ICD-10-CM | POA: Diagnosis not present

## 2015-04-16 DIAGNOSIS — E782 Mixed hyperlipidemia: Secondary | ICD-10-CM | POA: Diagnosis not present

## 2015-04-16 DIAGNOSIS — E1140 Type 2 diabetes mellitus with diabetic neuropathy, unspecified: Secondary | ICD-10-CM | POA: Diagnosis not present

## 2015-04-16 DIAGNOSIS — E1165 Type 2 diabetes mellitus with hyperglycemia: Secondary | ICD-10-CM | POA: Diagnosis not present

## 2015-04-16 DIAGNOSIS — E1142 Type 2 diabetes mellitus with diabetic polyneuropathy: Secondary | ICD-10-CM | POA: Diagnosis not present

## 2015-04-16 DIAGNOSIS — K219 Gastro-esophageal reflux disease without esophagitis: Secondary | ICD-10-CM | POA: Diagnosis not present

## 2015-04-16 DIAGNOSIS — R609 Edema, unspecified: Secondary | ICD-10-CM | POA: Diagnosis not present

## 2015-05-19 DIAGNOSIS — N816 Rectocele: Secondary | ICD-10-CM | POA: Diagnosis not present

## 2015-05-19 DIAGNOSIS — K3184 Gastroparesis: Secondary | ICD-10-CM | POA: Diagnosis not present

## 2015-05-19 DIAGNOSIS — E11649 Type 2 diabetes mellitus with hypoglycemia without coma: Secondary | ICD-10-CM | POA: Diagnosis not present

## 2015-05-19 DIAGNOSIS — E0843 Diabetes mellitus due to underlying condition with diabetic autonomic (poly)neuropathy: Secondary | ICD-10-CM | POA: Diagnosis not present

## 2015-05-19 DIAGNOSIS — I10 Essential (primary) hypertension: Secondary | ICD-10-CM | POA: Diagnosis not present

## 2015-05-19 DIAGNOSIS — G8929 Other chronic pain: Secondary | ICD-10-CM | POA: Diagnosis not present

## 2015-05-19 DIAGNOSIS — Z794 Long term (current) use of insulin: Secondary | ICD-10-CM | POA: Diagnosis not present

## 2015-05-19 DIAGNOSIS — K219 Gastro-esophageal reflux disease without esophagitis: Secondary | ICD-10-CM | POA: Diagnosis not present

## 2015-05-19 DIAGNOSIS — F17210 Nicotine dependence, cigarettes, uncomplicated: Secondary | ICD-10-CM | POA: Diagnosis not present

## 2015-05-19 DIAGNOSIS — Z86010 Personal history of colonic polyps: Secondary | ICD-10-CM | POA: Diagnosis not present

## 2015-05-19 DIAGNOSIS — K5902 Outlet dysfunction constipation: Secondary | ICD-10-CM | POA: Diagnosis not present

## 2015-05-19 DIAGNOSIS — E039 Hypothyroidism, unspecified: Secondary | ICD-10-CM | POA: Diagnosis not present

## 2015-05-19 DIAGNOSIS — R109 Unspecified abdominal pain: Secondary | ICD-10-CM | POA: Diagnosis not present

## 2015-05-19 DIAGNOSIS — R102 Pelvic and perineal pain: Secondary | ICD-10-CM | POA: Diagnosis not present

## 2015-05-19 DIAGNOSIS — K5901 Slow transit constipation: Secondary | ICD-10-CM | POA: Diagnosis not present

## 2015-05-19 DIAGNOSIS — E782 Mixed hyperlipidemia: Secondary | ICD-10-CM | POA: Diagnosis not present

## 2015-05-19 DIAGNOSIS — E1143 Type 2 diabetes mellitus with diabetic autonomic (poly)neuropathy: Secondary | ICD-10-CM | POA: Diagnosis not present

## 2015-05-29 DIAGNOSIS — K5901 Slow transit constipation: Secondary | ICD-10-CM | POA: Diagnosis not present

## 2015-05-29 DIAGNOSIS — E782 Mixed hyperlipidemia: Secondary | ICD-10-CM | POA: Diagnosis not present

## 2015-05-29 DIAGNOSIS — E039 Hypothyroidism, unspecified: Secondary | ICD-10-CM | POA: Diagnosis not present

## 2015-05-29 DIAGNOSIS — E11649 Type 2 diabetes mellitus with hypoglycemia without coma: Secondary | ICD-10-CM | POA: Diagnosis not present

## 2015-05-29 DIAGNOSIS — K648 Other hemorrhoids: Secondary | ICD-10-CM | POA: Diagnosis not present

## 2015-05-29 DIAGNOSIS — D131 Benign neoplasm of stomach: Secondary | ICD-10-CM | POA: Diagnosis not present

## 2015-05-29 DIAGNOSIS — R1011 Right upper quadrant pain: Secondary | ICD-10-CM | POA: Diagnosis not present

## 2015-05-29 DIAGNOSIS — I10 Essential (primary) hypertension: Secondary | ICD-10-CM | POA: Diagnosis not present

## 2015-05-29 DIAGNOSIS — E119 Type 2 diabetes mellitus without complications: Secondary | ICD-10-CM | POA: Diagnosis not present

## 2015-05-29 DIAGNOSIS — D125 Benign neoplasm of sigmoid colon: Secondary | ICD-10-CM | POA: Diagnosis not present

## 2015-05-29 DIAGNOSIS — Z1211 Encounter for screening for malignant neoplasm of colon: Secondary | ICD-10-CM | POA: Diagnosis not present

## 2015-05-29 DIAGNOSIS — K3184 Gastroparesis: Secondary | ICD-10-CM | POA: Diagnosis not present

## 2015-05-29 DIAGNOSIS — Z86010 Personal history of colonic polyps: Secondary | ICD-10-CM | POA: Diagnosis not present

## 2015-05-29 DIAGNOSIS — K2960 Other gastritis without bleeding: Secondary | ICD-10-CM | POA: Diagnosis not present

## 2015-05-29 DIAGNOSIS — F17210 Nicotine dependence, cigarettes, uncomplicated: Secondary | ICD-10-CM | POA: Diagnosis not present

## 2015-05-29 DIAGNOSIS — K2970 Gastritis, unspecified, without bleeding: Secondary | ICD-10-CM | POA: Diagnosis not present

## 2015-05-29 DIAGNOSIS — Z794 Long term (current) use of insulin: Secondary | ICD-10-CM | POA: Diagnosis not present

## 2015-05-29 DIAGNOSIS — D126 Benign neoplasm of colon, unspecified: Secondary | ICD-10-CM | POA: Diagnosis not present

## 2015-05-29 DIAGNOSIS — K319 Disease of stomach and duodenum, unspecified: Secondary | ICD-10-CM | POA: Diagnosis not present

## 2015-05-29 DIAGNOSIS — K219 Gastro-esophageal reflux disease without esophagitis: Secondary | ICD-10-CM | POA: Diagnosis not present

## 2015-11-25 DIAGNOSIS — Z794 Long term (current) use of insulin: Secondary | ICD-10-CM | POA: Diagnosis not present

## 2015-11-25 DIAGNOSIS — E039 Hypothyroidism, unspecified: Secondary | ICD-10-CM | POA: Diagnosis not present

## 2015-11-25 DIAGNOSIS — I10 Essential (primary) hypertension: Secondary | ICD-10-CM | POA: Diagnosis not present

## 2015-11-25 DIAGNOSIS — R296 Repeated falls: Secondary | ICD-10-CM | POA: Diagnosis not present

## 2015-11-25 DIAGNOSIS — E7800 Pure hypercholesterolemia, unspecified: Secondary | ICD-10-CM | POA: Diagnosis not present

## 2015-11-25 DIAGNOSIS — S7001XA Contusion of right hip, initial encounter: Secondary | ICD-10-CM | POA: Diagnosis not present

## 2015-11-25 DIAGNOSIS — S79911A Unspecified injury of right hip, initial encounter: Secondary | ICD-10-CM | POA: Diagnosis not present

## 2015-11-25 DIAGNOSIS — W1839XA Other fall on same level, initial encounter: Secondary | ICD-10-CM | POA: Diagnosis not present

## 2015-11-25 DIAGNOSIS — F17200 Nicotine dependence, unspecified, uncomplicated: Secondary | ICD-10-CM | POA: Diagnosis not present

## 2015-11-25 DIAGNOSIS — E119 Type 2 diabetes mellitus without complications: Secondary | ICD-10-CM | POA: Diagnosis not present

## 2015-11-25 DIAGNOSIS — Z79899 Other long term (current) drug therapy: Secondary | ICD-10-CM | POA: Diagnosis not present

## 2015-12-22 DIAGNOSIS — Z1231 Encounter for screening mammogram for malignant neoplasm of breast: Secondary | ICD-10-CM | POA: Diagnosis not present

## 2015-12-30 ENCOUNTER — Encounter (HOSPITAL_COMMUNITY): Payer: Self-pay

## 2015-12-30 ENCOUNTER — Observation Stay (HOSPITAL_COMMUNITY)
Admission: EM | Admit: 2015-12-30 | Discharge: 2015-12-31 | Disposition: A | Discharge status: Home / Self Care | Payer: Medicare Other | Attending: Internal Medicine | Admitting: Internal Medicine

## 2015-12-30 ENCOUNTER — Emergency Department (HOSPITAL_COMMUNITY): Payer: Medicare Other

## 2015-12-30 DIAGNOSIS — Z720 Tobacco use: Secondary | ICD-10-CM

## 2015-12-30 DIAGNOSIS — R079 Chest pain, unspecified: Secondary | ICD-10-CM | POA: Diagnosis not present

## 2015-12-30 DIAGNOSIS — Z794 Long term (current) use of insulin: Secondary | ICD-10-CM | POA: Diagnosis not present

## 2015-12-30 DIAGNOSIS — E039 Hypothyroidism, unspecified: Secondary | ICD-10-CM

## 2015-12-30 DIAGNOSIS — R0789 Other chest pain: Secondary | ICD-10-CM | POA: Diagnosis not present

## 2015-12-30 DIAGNOSIS — R609 Edema, unspecified: Secondary | ICD-10-CM | POA: Diagnosis not present

## 2015-12-30 DIAGNOSIS — K589 Irritable bowel syndrome without diarrhea: Secondary | ICD-10-CM

## 2015-12-30 DIAGNOSIS — F329 Major depressive disorder, single episode, unspecified: Secondary | ICD-10-CM | POA: Diagnosis not present

## 2015-12-30 DIAGNOSIS — E1142 Type 2 diabetes mellitus with diabetic polyneuropathy: Secondary | ICD-10-CM | POA: Diagnosis not present

## 2015-12-30 DIAGNOSIS — E1140 Type 2 diabetes mellitus with diabetic neuropathy, unspecified: Secondary | ICD-10-CM | POA: Diagnosis not present

## 2015-12-30 DIAGNOSIS — E782 Mixed hyperlipidemia: Secondary | ICD-10-CM | POA: Diagnosis not present

## 2015-12-30 DIAGNOSIS — E876 Hypokalemia: Secondary | ICD-10-CM | POA: Insufficient documentation

## 2015-12-30 DIAGNOSIS — E118 Type 2 diabetes mellitus with unspecified complications: Secondary | ICD-10-CM

## 2015-12-30 DIAGNOSIS — E1165 Type 2 diabetes mellitus with hyperglycemia: Secondary | ICD-10-CM | POA: Diagnosis not present

## 2015-12-30 DIAGNOSIS — I10 Essential (primary) hypertension: Secondary | ICD-10-CM

## 2015-12-30 DIAGNOSIS — K219 Gastro-esophageal reflux disease without esophagitis: Secondary | ICD-10-CM | POA: Insufficient documentation

## 2015-12-30 DIAGNOSIS — Z23 Encounter for immunization: Secondary | ICD-10-CM | POA: Diagnosis not present

## 2015-12-30 DIAGNOSIS — F17210 Nicotine dependence, cigarettes, uncomplicated: Secondary | ICD-10-CM | POA: Insufficient documentation

## 2015-12-30 DIAGNOSIS — F32A Depression, unspecified: Secondary | ICD-10-CM

## 2015-12-30 DIAGNOSIS — E785 Hyperlipidemia, unspecified: Secondary | ICD-10-CM | POA: Diagnosis not present

## 2015-12-30 DIAGNOSIS — R072 Precordial pain: Secondary | ICD-10-CM | POA: Diagnosis not present

## 2015-12-30 DIAGNOSIS — Z79899 Other long term (current) drug therapy: Secondary | ICD-10-CM | POA: Diagnosis not present

## 2015-12-30 DIAGNOSIS — G629 Polyneuropathy, unspecified: Secondary | ICD-10-CM

## 2015-12-30 DIAGNOSIS — K5902 Outlet dysfunction constipation: Secondary | ICD-10-CM | POA: Diagnosis not present

## 2015-12-30 DIAGNOSIS — E11319 Type 2 diabetes mellitus with unspecified diabetic retinopathy without macular edema: Secondary | ICD-10-CM | POA: Diagnosis not present

## 2015-12-30 DIAGNOSIS — R42 Dizziness and giddiness: Secondary | ICD-10-CM | POA: Diagnosis not present

## 2015-12-30 LAB — CBC WITH DIFFERENTIAL/PLATELET
BASOS ABS: 0.1 10*3/uL (ref 0.0–0.1)
Basophils Relative: 1 %
Eosinophils Absolute: 0.2 10*3/uL (ref 0.0–0.7)
Eosinophils Relative: 2 %
HEMATOCRIT: 38.4 % (ref 36.0–46.0)
HEMOGLOBIN: 13.4 g/dL (ref 12.0–15.0)
Lymphocytes Relative: 38 %
Lymphs Abs: 4.4 10*3/uL — ABNORMAL HIGH (ref 0.7–4.0)
MCH: 29.3 pg (ref 26.0–34.0)
MCHC: 34.9 g/dL (ref 30.0–36.0)
MCV: 84 fL (ref 78.0–100.0)
Monocytes Absolute: 0.4 10*3/uL (ref 0.1–1.0)
Monocytes Relative: 3 %
NEUTROS ABS: 6.6 10*3/uL (ref 1.7–7.7)
Neutrophils Relative %: 56 %
Platelets: 180 10*3/uL (ref 150–400)
RBC: 4.57 MIL/uL (ref 3.87–5.11)
RDW: 13.3 % (ref 11.5–15.5)
WBC: 11.6 10*3/uL — AB (ref 4.0–10.5)

## 2015-12-30 LAB — BASIC METABOLIC PANEL
ANION GAP: 15 (ref 5–15)
BUN: 14 mg/dL (ref 6–20)
CALCIUM: 9 mg/dL (ref 8.9–10.3)
CO2: 26 mmol/L (ref 22–32)
Chloride: 91 mmol/L — ABNORMAL LOW (ref 101–111)
Creatinine, Ser: 0.96 mg/dL (ref 0.44–1.00)
GFR calc non Af Amer: >60 mL/min (ref >60)
GLUCOSE: 341 mg/dL — AB (ref 65–99)
Potassium: 2.7 mmol/L — CL (ref 3.5–5.1)
Sodium: 132 mmol/L — ABNORMAL LOW (ref 135–145)

## 2015-12-30 LAB — CBG MONITORING, ED: Glucose-Capillary: 250 mg/dL — ABNORMAL HIGH (ref 65–99)

## 2015-12-30 LAB — TROPONIN I

## 2015-12-30 LAB — I-STAT TROPONIN, ED: Troponin i, poc: 0 ng/mL (ref 0.00–0.08)

## 2015-12-30 LAB — GLUCOSE, CAPILLARY: Glucose-Capillary: 315 mg/dL — ABNORMAL HIGH (ref 65–99)

## 2015-12-30 LAB — MAGNESIUM: MAGNESIUM: 1.8 mg/dL (ref 1.7–2.4)

## 2015-12-30 MED ORDER — ENOXAPARIN SODIUM 40 MG/0.4ML ~~LOC~~ SOLN
40.0000 mg | SUBCUTANEOUS | Status: DC
  Administered 2015-12-30: 40 mg via SUBCUTANEOUS
  Filled 2015-12-30: qty 0.4

## 2015-12-30 MED ORDER — NITROGLYCERIN 0.4 MG SL SUBL
0.4000 mg | SUBLINGUAL_TABLET | SUBLINGUAL | Status: AC | PRN
  Administered 2015-12-30 (×3): 0.4 mg via SUBLINGUAL
  Filled 2015-12-30 (×2): qty 1

## 2015-12-30 MED ORDER — ALPRAZOLAM 0.25 MG PO TABS
0.2500 mg | ORAL_TABLET | Freq: Two times a day (BID) | ORAL | Status: DC | PRN
Start: 2015-12-30 — End: 2015-12-31

## 2015-12-30 MED ORDER — GI COCKTAIL ~~LOC~~
30.0000 mL | Freq: Once | ORAL | Status: DC
  Filled 2015-12-30: qty 30

## 2015-12-30 MED ORDER — POTASSIUM CHLORIDE CRYS ER 20 MEQ PO TBCR
40.0000 meq | EXTENDED_RELEASE_TABLET | Freq: Once | ORAL | Status: AC
  Administered 2015-12-30: 40 meq via ORAL
  Filled 2015-12-30: qty 2

## 2015-12-30 MED ORDER — ASPIRIN EC 325 MG PO TBEC
325.0000 mg | DELAYED_RELEASE_TABLET | Freq: Every day | ORAL | Status: DC
  Administered 2015-12-31: 325 mg via ORAL
  Filled 2015-12-30: qty 1

## 2015-12-30 MED ORDER — DULOXETINE HCL 30 MG PO CPEP
30.0000 mg | ORAL_CAPSULE | Freq: Two times a day (BID) | ORAL | Status: DC
  Administered 2015-12-30 – 2015-12-31 (×2): 30 mg via ORAL
  Filled 2015-12-30 (×2): qty 1

## 2015-12-30 MED ORDER — GABAPENTIN 600 MG PO TABS
1200.0000 mg | ORAL_TABLET | Freq: Two times a day (BID) | ORAL | Status: DC
  Administered 2015-12-30 – 2015-12-31 (×2): 1200 mg via ORAL
  Filled 2015-12-30 (×2): qty 2

## 2015-12-30 MED ORDER — SODIUM CHLORIDE 0.9% FLUSH
3.0000 mL | Freq: Two times a day (BID) | INTRAVENOUS | Status: DC
  Administered 2015-12-30 (×2): 3 mL via INTRAVENOUS

## 2015-12-30 MED ORDER — VALSARTAN-HYDROCHLOROTHIAZIDE 160-12.5 MG PO TABS: 1.0000 | ORAL_TABLET | Freq: Every day | ORAL | Status: DC

## 2015-12-30 MED ORDER — LEVOTHYROXINE SODIUM 175 MCG PO TABS
175.0000 ug | ORAL_TABLET | Freq: Every day | ORAL | Status: DC
  Administered 2015-12-31: 175 ug via ORAL
  Filled 2015-12-30: qty 1

## 2015-12-30 MED ORDER — GI COCKTAIL ~~LOC~~
30.0000 mL | Freq: Once | ORAL | Status: AC
  Administered 2015-12-30: 30 mL via ORAL
  Filled 2015-12-30: qty 30

## 2015-12-30 MED ORDER — INSULIN DETEMIR 100 UNIT/ML ~~LOC~~ SOLN
90.0000 [IU] | Freq: Every day | SUBCUTANEOUS | Status: DC
  Administered 2015-12-31: 90 [IU] via SUBCUTANEOUS
  Filled 2015-12-30: qty 0.9

## 2015-12-30 MED ORDER — SODIUM CHLORIDE 0.9% FLUSH: 3.0000 mL | Freq: Two times a day (BID) | INTRAVENOUS | Status: DC

## 2015-12-30 MED ORDER — ONDANSETRON HCL 4 MG PO TABS: 4.0000 mg | ORAL_TABLET | Freq: Four times a day (QID) | ORAL | Status: DC | PRN

## 2015-12-30 MED ORDER — PANTOPRAZOLE SODIUM 40 MG PO TBEC
80.0000 mg | DELAYED_RELEASE_TABLET | Freq: Every day | ORAL | Status: DC
  Administered 2015-12-30 – 2015-12-31 (×2): 80 mg via ORAL
  Filled 2015-12-30 (×2): qty 2

## 2015-12-30 MED ORDER — LINACLOTIDE 145 MCG PO CAPS
145.0000 ug | ORAL_CAPSULE | Freq: Every day | ORAL | Status: DC
  Administered 2015-12-31: 145 ug via ORAL
  Filled 2015-12-30 (×2): qty 1

## 2015-12-30 MED ORDER — INSULIN ASPART 100 UNIT/ML ~~LOC~~ SOLN
0.0000 [IU] | Freq: Every day | SUBCUTANEOUS | Status: DC
  Administered 2015-12-30: 4 [IU] via SUBCUTANEOUS

## 2015-12-30 MED ORDER — INSULIN ASPART 100 UNIT/ML ~~LOC~~ SOLN
0.0000 [IU] | Freq: Three times a day (TID) | SUBCUTANEOUS | Status: DC
  Administered 2015-12-30 – 2015-12-31 (×2): 3 [IU] via SUBCUTANEOUS
  Administered 2015-12-31: 9 [IU] via SUBCUTANEOUS

## 2015-12-30 MED ORDER — ACETAMINOPHEN 325 MG PO TABS: 650.0000 mg | ORAL_TABLET | Freq: Four times a day (QID) | ORAL | Status: DC | PRN

## 2015-12-30 MED ORDER — IRBESARTAN 150 MG PO TABS
150.0000 mg | ORAL_TABLET | Freq: Every day | ORAL | Status: DC
  Administered 2015-12-31: 150 mg via ORAL
  Filled 2015-12-30: qty 1

## 2015-12-30 MED ORDER — GI COCKTAIL ~~LOC~~
30.0000 mL | Freq: Four times a day (QID) | ORAL | Status: DC | PRN
  Administered 2015-12-30: 30 mL via ORAL

## 2015-12-30 MED ORDER — POTASSIUM CHLORIDE CRYS ER 20 MEQ PO TBCR
60.0000 meq | EXTENDED_RELEASE_TABLET | Freq: Once | ORAL | Status: AC
Start: 2015-12-30 — End: 2015-12-30
  Administered 2015-12-30: 60 meq via ORAL
  Filled 2015-12-30: qty 3

## 2015-12-30 MED ORDER — PRAVASTATIN SODIUM 40 MG PO TABS
40.0000 mg | ORAL_TABLET | Freq: Every day | ORAL | Status: DC
  Administered 2015-12-30: 40 mg via ORAL
  Filled 2015-12-30: qty 1

## 2015-12-30 MED ORDER — ONDANSETRON HCL 4 MG/2ML IJ SOLN: 4.0000 mg | Freq: Four times a day (QID) | INTRAMUSCULAR | Status: DC | PRN

## 2015-12-30 MED ORDER — SODIUM CHLORIDE 0.9 % IV SOLN: 250.0000 mL | INTRAVENOUS | Status: DC | PRN

## 2015-12-30 MED ORDER — HYDROCHLOROTHIAZIDE 12.5 MG PO CAPS
12.5000 mg | ORAL_CAPSULE | Freq: Every day | ORAL | Status: DC
  Administered 2015-12-31: 12.5 mg via ORAL
  Filled 2015-12-30: qty 1

## 2015-12-30 MED ORDER — SODIUM CHLORIDE 0.9% FLUSH: 3.0000 mL | INTRAVENOUS | Status: DC | PRN

## 2015-12-30 MED ORDER — ACETAMINOPHEN 650 MG RE SUPP: 650.0000 mg | Freq: Four times a day (QID) | RECTAL | Status: DC | PRN

## 2015-12-30 NOTE — ED Provider Notes (Signed)
CSN: [id number]     Arrival date & time 12/30/15  1046 History   First MD Initiated Contact with Patient 12/30/15 1047     Chief Complaint  Patient presents with  . Chest Pain     (Consider location/radiation/quality/duration/timing/severity/associated sxs/prior Treatment) HPI Comments: Kelsey Conley is a 60 y.o. female with a PMHx of DM2, HTN, HLD, neuropathy, migraines, IBS, and ?R neck lymph node cancer, with a PSHx of cholecystectomy, low back surgery, and tubal ligation, who presents to the ED with complaints of sudden onset chest pain that began around 9 AM when she was sitting in her doctor's office for a follow-up appointment on her diabetes. She describes the pain currently as 3/10 but states that initially was 6/10. She is the pain as being pressure-like and somewhat sharp in the center of her chest radiating into her mid back, intermittent, with no known aggravating factors, unchanged with exertion or inspiration, and improved with 324 mg aspirin and one nitroglycerin given en route. She states she has had chest pain in the past and was evaluated at [facility] and was told that it was "neuropathy". She states this morning she felt generally weak prior to onset of symptoms. Associated symptoms include lightheadedness and diaphoresis at the time that her chest pain started. She is a smoker. Positive family history of "a massive MI that killed" her paternal grandmother in her 58s or 60s. She also states she has had a chronic cough for several months, with no new changes at this time.  She denies any fevers, chills, shortness of breath, wheezing, leg swelling, recent travel/surgery/duration, estrogen use, active cancer, personal or family history of DVT/PE, abdominal pain, nausea, vomiting, diarrhea, constipation, dysuria, hematuria, numbness, tingling, weakness, syncope, orthopnea, or claudication. PCP is Dr. Alyson Ingles of [facility]  Patient is a 60 y.o. female presenting with chest  pain. The history is provided by the patient and medical records. No language interpreter was used.  Chest Pain Pain location:  Substernal area Pain quality: pressure and sharp   Pain radiates to:  Mid back Pain radiates to the back: yes   Pain severity:  Moderate Onset quality:  Sudden Duration:  2 hours Timing:  Intermittent Progression:  Improving Chronicity:  Recurrent Context: at rest   Relieved by:  Aspirin and nitroglycerin Worsened by:  Nothing tried Ineffective treatments:  None tried Associated symptoms: back pain (radiating from chest), diaphoresis and fatigue (generalized weakness this morning)   Associated symptoms: no abdominal pain, no claudication, no cough (chronic cough, nothing changed/new), no fever, no lower extremity edema, no nausea, no numbness, no orthopnea, no shortness of breath, no syncope, not vomiting and no weakness   Risk factors: diabetes mellitus, high cholesterol, hypertension and smoking   Risk factors: no birth control, no immobilization, no prior DVT/PE and no surgery     Past Medical History  Diagnosis Date  . Diabetes mellitus without complication ([place])   . Hypertension   . Cancer [facility])     lymph node right neck  . Neuromuscular disorder (HCC)     neuropathy  . Migraines    Past Surgical History  Procedure Laterality Date  . Cholecystectomy    . Lower back surgery      L4, L5, L3  . Tubaligation    . Neck surgery     Family History  Problem Relation Age of Onset  . Migraines Mother   . Crohn's disease Mother   . Hypertension Mother   . Stroke Father   .  Migraines Sister   . Diabetes Sister   . Hypertension Sister   . Thyroid disease Sister   . Cancer Maternal Aunt   . Cancer Maternal Uncle   . Cancer Paternal Uncle     brain, lung, liver, stomach  . Cancer Maternal Grandmother   . Diabetes Maternal Grandmother   . Cancer Maternal Grandfather     prostate   Social History  Substance Use Topics  . Smoking status:  Current Every Day Smoker -- 1.00 packs/day    Types: Cigarettes  . Smokeless tobacco: None  . Alcohol Use: No   OB History    No data available     Review of Systems  Constitutional: Positive for diaphoresis and fatigue (generalized weakness this morning). Negative for fever and chills.  Respiratory: Negative for cough (chronic cough, nothing changed/new), shortness of breath and wheezing.   Cardiovascular: Positive for chest pain. Negative for orthopnea, claudication, leg swelling and syncope.  Gastrointestinal: Negative for nausea, vomiting, abdominal pain, diarrhea and constipation.  Genitourinary: Negative for dysuria and hematuria.  Musculoskeletal: Positive for back pain (radiating from chest). Negative for myalgias and arthralgias.  Skin: Negative for color change.  Allergic/Immunologic: Positive for immunocompromised state (diabetic).  Neurological: Positive for light-headedness. Negative for syncope, weakness and numbness.  Psychiatric/Behavioral: Negative for confusion.   10 Systems reviewed and are negative for acute change except as noted in the HPI.    Allergies  Aspirin and Codeine  Home Medications   Prior to Admission medications   Medication Sig Start Date End Date Taking? Authorizing Provider  Camphor-Eucalyptus-Menthol (VICKS VAPORUB) 4.73-1.2-2.6 % OINT Apply 1 application topically daily as needed (for cold and cough).    Historical Provider, MD  DULoxetine (CYMBALTA) 30 MG capsule Take 30 mg by mouth 2 (two) times daily.    Historical Provider, MD  esomeprazole (NEXIUM) 40 MG capsule Take 40 mg by mouth 2 (two) times daily.    Historical Provider, MD  gabapentin (NEURONTIN) 600 MG tablet Take 1,200 mg by mouth 2 (two) times daily.     Historical Provider, MD  glimepiride (AMARYL) 4 MG tablet Take 4 mg by mouth 2 (two) times daily.    Historical Provider, MD  guaiFENesin (ROBITUSSIN) 100 MG/5ML SOLN Take 15 mLs by mouth every 4 (four) hours as needed for cough  or to loosen phlegm.    Historical Provider, MD  insulin detemir (LEVEMIR) 100 UNIT/ML injection Inject 50 Units into the skin daily.    Historical Provider, MD  levothyroxine (SYNTHROID, LEVOTHROID) 175 MCG tablet Take 175 mcg by mouth daily before breakfast.    Historical Provider, MD  menthol-cetylpyridinium (CEPACOL) 3 MG lozenge Take 1 lozenge by mouth as needed for sore throat.    Historical Provider, MD  metFORMIN (GLUCOPHAGE) 1000 MG tablet Take 1,000 mg by mouth 2 (two) times daily with a meal.    Historical Provider, MD  Phenylephrine-Pheniramine-DM [facility] COLD & COUGH) 08-14-19 MG PACK Take 1 packet by mouth daily as needed (for cough and congestion).    Historical Provider, MD  pravastatin (PRAVACHOL) 40 MG tablet Take 40 mg by mouth at bedtime.    Historical Provider, MD  Pseudoeph-Doxylamine-DM-APAP (NYQUIL PO) Take 15-30 mLs by mouth daily as needed (for cough).    Historical Provider, MD  pseudoephedrine (SUDAFED) 30 MG tablet Take 30 mg by mouth every 4 (four) hours as needed for congestion.    Historical Provider, MD  sitaGLIPtin (JANUVIA) 100 MG tablet Take 100 mg by mouth daily.  Historical Provider, MD  valsartan-hydrochlorothiazide (DIOVAN-HCT) 160-12.5 MG per tablet Take 1 tablet by mouth daily.    Historical Provider, MD   BP 120/75 mmHg  Pulse 66  Temp(Src) 97.9 F (36.6 C) (Oral)  Resp 25  Ht 5\' 8"  (1.727 m)  Wt 87.998 kg  BMI 29.50 kg/m2  SpO2 98% Physical Exam  Constitutional: She is oriented to person, place, and time. Vital signs are normal. She appears well-developed and well-nourished.  Non-toxic appearance. No distress.  Afebrile, nontoxic, NAD  HENT:  Head: Normocephalic and atraumatic.  Mouth/Throat: Oropharynx is clear and moist and mucous membranes are normal.  Eyes: Conjunctivae and EOM are normal. Right eye exhibits no discharge. Left eye exhibits no discharge.  Neck: Normal range of motion. Neck supple.  Cardiovascular: Normal rate, regular  rhythm, normal heart sounds and intact distal pulses.  Exam reveals no gallop and no friction rub.   No murmur heard. RRR, nl s1/s2, no m/r/g, distal pulses intact, no pedal edema   Pulmonary/Chest: Effort normal and breath sounds normal. No respiratory distress. She has no decreased breath sounds. She has no wheezes. She has no rhonchi. She has no rales. She exhibits no tenderness, no crepitus, no deformity and no retraction.  CTAB in all lung fields, no w/r/r, no hypoxia or increased WOB, speaking in full sentences, SpO2 99% on RA  Chest wall nonTTP without crepitus, deformities, or retractions   Abdominal: Soft. Normal appearance and bowel sounds are normal. She exhibits no distension. There is no tenderness. There is no rigidity, no rebound, no guarding, no CVA tenderness, no tenderness at McBurney's point and negative Murphy's sign.  Musculoskeletal: Normal range of motion.  MAE x4 Strength and sensation grossly intact Distal pulses intact No pedal edema, neg homan's bilaterally   Neurological: She is alert and oriented to person, place, and time. She has normal strength. No sensory deficit.  Skin: Skin is warm, dry and intact. No rash noted.  Psychiatric: She has a normal mood and affect.  Nursing note and vitals reviewed.   ED Course  Procedures (including critical care time) Labs Review Labs Reviewed  BASIC METABOLIC PANEL - Abnormal; Notable for the following:    Sodium 132 (*)    Potassium 2.7 (*)    Chloride 91 (*)    Glucose, Bld 341 (*)    All other components within normal limits  CBC WITH DIFFERENTIAL/PLATELET - Abnormal; Notable for the following:    WBC 11.6 (*)    Lymphs Abs 4.4 (*)    All other components within normal limits  Randolm Idol, ED    Imaging Review Dg Chest 2 View  12/30/2015  CLINICAL DATA:  Hyperglycemia today.  Chest pain. EXAM: CHEST  2 VIEW COMPARISON:  Portable examinations 02/01/2012 and 12/13/2009. FINDINGS: Suboptimal inspiration on the  lateral view. The heart size and mediastinal contours are normal. The lungs are clear. There is no pleural effusion or pneumothorax. No acute osseous findings are identified. IMPRESSION: No active cardiopulmonary process. Electronically Signed   By: Richardean Sale M.D.   On: 12/30/2015 11:43   I have personally reviewed and evaluated these images and lab results as part of my medical decision-making.   EKG Interpretation   Date/Time:  Tuesday December 30 2015 12:20:08 EST Ventricular Rate:  65 PR Interval:  178 QRS Duration: 125 QT Interval:  489 QTC Calculation: 508 R Axis:   26 Text Interpretation:  Sinus rhythm Nonspecific intraventricular conduction  delay Nonspecific T abnormalities, anterior leads Confirmed by  Ryker.Hammer  MD,  [place] [phone]), editor [place]  CCT, [place] ([id number]) on 12/30/2015  12:37:53 PM      MDM   Final diagnoses:  Chest pain of uncertain etiology  Hypokalemia  Lightheadedness  Type 2 diabetes mellitus with hyperglycemia, with long-term current use of insulin (HCC)  Tobacco use    60 y.o. female here with sudden onset CP that started at her PCP's office this morning, while at rest. She states she felt generally weak this morning before her appt, and when the CP started she got diaphoretic and lightheaded. Denies SOB, LE swelling, no tachycardia or hypoxiaor RFs for PE/DVT, doubt this as a source. Doubt dissection although pain does radiate to her back, but pulses equal in all extremities. Was given 1ASA and 1NTG which resolved the pain. On exam, pain is not reproducible. Clear lung sounds. Extremities NVI. EKG poor quality but low-voltage with nonspecific IVCD which has been seen in prior EKGs. Will repeat EKG for better quality. Given her RFs for ACS, including age, medical comorbids, family hx, and smoking hx, as well as her concerning story and abnormal EKG findings , her HEART score is 5 which is moderate-high risk, so she will likely need admission for obs.  Will order more NTG in the event that she has recurrent pain, and give GI cocktail. Pt refuses morphine. Was given ASA already so no need for more of this. Will obtain labs and CXR and reassess shortly.   12:27 PM Repeat EKG better quality, still showing TWI in lateral leads and T wave flattening in anterior leads, with nonspecific IVCD. Trop neg. BMP showing K 2.7 with gluc 341, will replete orally now; Na 132 which corrects from glucose. CBC w/diff with mildly elevated WBC 11.6, nonspecific. CXR neg. Symptoms improved after 4NTG total, currently CP free. Will proceed with admission for ACS r/o  12:52 PM Dr. Marily Memos of [facility] returning page, will admit to tele obs. Holding orders placed. Please see his notes for further documentation of care. Care discussed with my attending Dr. Billy Fischer who agrees with this plan. Pt stable at this time.  BP 104/59 mmHg  Pulse 65  Temp(Src) 97.9 F (36.6 C) (Oral)  Resp 19  Ht 5\' 8"  (1.727 m)  Wt 87.998 kg  BMI 29.50 kg/m2  SpO2 97%  Meds ordered this encounter  Medications  . nitroGLYCERIN (NITROSTAT) SL tablet 0.4 mg    Sig:   . gi cocktail (Maalox,Lidocaine,Donnatal)    Sig:   . potassium chloride SA (K-DUR,KLOR-CON) CR tablet 60 mEq    Sig:        Geanie Pacifico Camprubi-Soms, PA-C 12/30/15 1252  Chlora Mcbain Camprubi-Soms, PA-C 12/30/15 1253  Gareth Morgan, MD 12/31/15 2247

## 2015-12-30 NOTE — ED Notes (Signed)
Miranda, RN accepts report at this time

## 2015-12-30 NOTE — H&P (Addendum)
[facility] History and Physical  Kelsey Conley [id number] DOB: 1956-09-23 DOA: 12/30/2015  Referring physician: Jaci Conley PCP: Kelsey Austria, MD   Chief Complaint: CP  HPI: Kelsey Conley is a 60 y.o. female  CP. Acute onset. Radiates to back. Started around 09:00. Came on while at rest at PCP office. Improvement with nitroglycerin and aspirin 324. Intermittent episodes since onset this am. Unchanged w/ exertion, deep respirations or palpation. Similar episodes in past and seen at [facility] and Dx w/ neuropathy. Pt states she gets similar episodes on a weekly basis for years. Associated with lightheadedness, diaphoresis. Pain does not radiate. No history significant for father with fatal MI in his late 26s early 73s.  Review of Systems:  Constitutional:  No weight loss, night sweats, Fevers, chills, fatigue.  HEENT:  No headaches, Difficulty swallowing,Tooth/dental problems,Sore throat, Cardio-vascular:  No cOrthopnea, PND, swelling in lower extremities, anasarca, dizziness, palpitations  GI:  No heartburn, indigestion, abdominal pain, nausea, vomiting, diarrhea, change in bowel habits, loss of appetite  Resp:   No shortness of breath with exertion or at rest. No excess mucus, no productive cough, No non-productive cough, No coughing up of blood.No change in color of mucus.No wheezing.No chest wall deformity  Skin:  no rash or lesions.  GU:  no dysuria, change in color of urine, no urgency or frequency. No flank pain.  Musculoskeletal:   No joint pain or swelling. No decreased range of motion. No back pain.  Psych:  No change in mood or affect. No depression or anxiety. No memory loss.  Neuro:  No change in sensation, unilateral strength, or cognitive abilities  All other systems were reviewed and are negative.  Past Medical History  Diagnosis Date  . Diabetes mellitus without complication ([place])   . Hypertension   . Cancer [facility])     lymph  node right neck  . Neuromuscular disorder (HCC)     neuropathy  . Migraines    Past Surgical History  Procedure Laterality Date  . Cholecystectomy    . Lower back surgery      L4, L5, L3  . Tubaligation    . Neck surgery     Social History:  reports that she has been smoking Cigarettes.  She has been smoking about 1.00 pack per day. She does not have any smokeless tobacco history on file. She reports that she does not drink alcohol or use illicit drugs.  Allergies  Allergen Reactions  . Aspirin Nausea And Vomiting    Headaches and bad stomach cramps   . Codeine Nausea And Vomiting    Headache and bad stomach cramps    Family History  Problem Relation Age of Onset  . Migraines Mother   . Crohn's disease Mother   . Hypertension Mother   . Stroke Father   . Migraines Sister   . Diabetes Sister   . Hypertension Sister   . Thyroid disease Sister   . Cancer Maternal Aunt   . Cancer Maternal Uncle   . Cancer Paternal Uncle     brain, lung, liver, stomach  . Cancer Maternal Grandmother   . Diabetes Maternal Grandmother   . Cancer Maternal Grandfather     prostate     Prior to Admission medications   Medication Sig Start Date End Date Taking? Authorizing Provider  DULoxetine (CYMBALTA) 30 MG capsule Take 30 mg by mouth 2 (two) times daily.   Yes Historical Provider, MD  esomeprazole (NEXIUM) 40 MG capsule Take  40 mg by mouth 2 (two) times daily.   Yes Historical Provider, MD  gabapentin (NEURONTIN) 600 MG tablet Take 1,200 mg by mouth 2 (two) times daily.    Yes Historical Provider, MD  glimepiride (AMARYL) 4 MG tablet Take 4 mg by mouth 2 (two) times daily.   Yes Historical Provider, MD  insulin detemir (LEVEMIR) 100 UNIT/ML injection Inject 90 Units into the skin daily.    Yes Historical Provider, MD  levothyroxine (SYNTHROID, LEVOTHROID) 175 MCG tablet Take 175 mcg by mouth daily before breakfast.   Yes Historical Provider, MD  LINZESS 145 MCG CAPS capsule Take 145 mg by  mouth daily. 11/24/15  Yes Historical Provider, MD  metFORMIN (GLUCOPHAGE) 1000 MG tablet Take 1,000 mg by mouth 2 (two) times daily with a meal.   Yes Historical Provider, MD  pravastatin (PRAVACHOL) 40 MG tablet Take 40 mg by mouth at bedtime.   Yes Historical Provider, MD  sitaGLIPtin (JANUVIA) 100 MG tablet Take 100 mg by mouth daily.   Yes Historical Provider, MD  valsartan-hydrochlorothiazide (DIOVAN-HCT) 160-12.5 MG per tablet Take 1 tablet by mouth daily.   Yes Historical Provider, MD   Physical Exam: Filed Vitals:   12/30/15 1245 12/30/15 1315 12/30/15 1330 12/30/15 1345  BP: 104/59 112/70 138/70 108/54  Pulse: 65 58 73 60  Temp:      TempSrc:      Resp: 19 24 27 25   Height:      Weight:      SpO2: 97% 96% 95% 93%    Wt Readings from Last 3 Encounters:  12/30/15 87.998 kg (194 lb)  10/27/14 90.719 kg (200 lb)  07/19/13 96.163 kg (212 lb)    General:  Appears calm and comfortable Eyes:  PERRL, EOMI, normal lids, iris ENT:  grossly normal hearing, lips & tongue Neck:  no LAD, masses or thyromegaly Cardiovascular:  RRR, no m/r/g. No LE edema.  Respiratory:  CTA bilaterally, no w/r/r. Normal respiratory effort. Abdomen:  soft, ntnd Skin:  no rash or induration seen on limited exam Musculoskeletal:  grossly normal tone BUE/BLE Psychiatric:  grossly normal mood and affect, speech fluent and appropriate Neurologic:  CN 2-12 grossly intact, moves all extremities in coordinated fashion.          Labs on Admission:  Basic Metabolic Panel:  Recent Labs Lab 12/30/15 1141  NA 132*  K 2.7*  CL 91*  CO2 26  GLUCOSE 341*  BUN 14  CREATININE 0.96  CALCIUM 9.0   Liver Function Tests: No results for input(s): AST, ALT, ALKPHOS, BILITOT, PROT, ALBUMIN in the last 168 hours. No results for input(s): LIPASE, AMYLASE in the last 168 hours. No results for input(s): AMMONIA in the last 168 hours. CBC:  Recent Labs Lab 12/30/15 1141  WBC 11.6*  NEUTROABS 6.6  HGB 13.4    HCT 38.4  MCV 84.0  PLT 180   Cardiac Enzymes: No results for input(s): CKTOTAL, CKMB, CKMBINDEX, TROPONINI in the last 168 hours.  BNP (last 3 results) No results for input(s): BNP in the last 8760 hours.  ProBNP (last 3 results) No results for input(s): PROBNP in the last 8760 hours.   CREATININE: 0.96 (12/30/15 1141) Estimated creatinine clearance - 74.1 mL/min  CBG: No results for input(s): GLUCAP in the last 168 hours.  Radiological Exams on Admission: Dg Chest 2 View  12/30/2015  CLINICAL DATA:  Hyperglycemia today.  Chest pain. EXAM: CHEST  2 VIEW COMPARISON:  Portable examinations 02/01/2012 and 12/13/2009. FINDINGS: Suboptimal  inspiration on the lateral view. The heart size and mediastinal contours are normal. The lungs are clear. There is no pleural effusion or pneumothorax. No acute osseous findings are identified. IMPRESSION: No active cardiopulmonary process. Electronically Signed   By: Richardean Sale M.D.   On: 12/30/2015 11:43    Assessment/Plan Principal Problem:   Chest pain Active Problems:   Diabetes mellitus with complication (HCC)   Essential hypertension   IBS (irritable bowel syndrome)   GERD (gastroesophageal reflux disease)   Hypothyroid   Depression   Peripheral neuropathy (HCC)   CP: HEART score 5. Cardiac vs psychosomatic. Mixed picture w/ regards to description of sx. Relieved w/ nitro and ASA. Fmhx of death from MI in father in 79s. Pt Dx w/ neuropathic CP in past???. Pt states these episodes are similar to previous episodes - occur wkly. EKG w/o ACS and trop neg. Observation for r/o - Tele - cycle trop - EKG in am - GI cocktail - outpt f/u w/ cards   Hypokalemia: 2.7 on admission. 41mEq K given in ED. - Kdur 13mEq x1 - Mag  DM: - continue home levemir - SSI  HTN: - continue diovan, HCTZ  Peripheral neuropathy: - continue neurontin  GERD: - continue PPI  IBS: - continue linzess  Hypothyroid: - continue  synthroid  HLD: - contionue statin  Depresison: - continue cymbalta  Code Status: FULL  DVT Prophylaxis: Lovenox Family Communication: none Disposition Plan: Pending Improvement    MERRELL, DAVID Lenna Sciara, MD Family Medicine [facility] [email or web address] Password [email or web address]

## 2015-12-30 NOTE — ED Notes (Signed)
CBG 250

## 2015-12-30 NOTE — ED Notes (Signed)
Pt brought in EMS from [facility] for chest pain.  Pt reports she has left upper chest pain that radiates into back.  Pain is described as sharp.  Pt given 324mg  ASA and 1 nitro PTA.  The pain was originally a 6/10, pain was alleviated with nitro but has now come back and is rated at a 4/10.  Pt reports she has been evaluated for similar chest pain at [facility] in November and was told it was "neuropathy."

## 2015-12-30 NOTE — ED Notes (Signed)
MD at bedside. 

## 2015-12-31 ENCOUNTER — Other Ambulatory Visit: Payer: Self-pay | Admitting: Student

## 2015-12-31 ENCOUNTER — Encounter (HOSPITAL_COMMUNITY): Payer: Self-pay | Admitting: Student

## 2015-12-31 ENCOUNTER — Observation Stay (HOSPITAL_BASED_OUTPATIENT_CLINIC_OR_DEPARTMENT_OTHER): Payer: Medicare Other

## 2015-12-31 DIAGNOSIS — K219 Gastro-esophageal reflux disease without esophagitis: Secondary | ICD-10-CM | POA: Diagnosis not present

## 2015-12-31 DIAGNOSIS — E118 Type 2 diabetes mellitus with unspecified complications: Secondary | ICD-10-CM

## 2015-12-31 DIAGNOSIS — I10 Essential (primary) hypertension: Secondary | ICD-10-CM | POA: Diagnosis not present

## 2015-12-31 DIAGNOSIS — R079 Chest pain, unspecified: Secondary | ICD-10-CM | POA: Diagnosis not present

## 2015-12-31 DIAGNOSIS — Z720 Tobacco use: Secondary | ICD-10-CM

## 2015-12-31 DIAGNOSIS — E1142 Type 2 diabetes mellitus with diabetic polyneuropathy: Secondary | ICD-10-CM | POA: Diagnosis not present

## 2015-12-31 DIAGNOSIS — R072 Precordial pain: Secondary | ICD-10-CM | POA: Diagnosis not present

## 2015-12-31 DIAGNOSIS — E785 Hyperlipidemia, unspecified: Secondary | ICD-10-CM | POA: Diagnosis not present

## 2015-12-31 LAB — CBC
HEMATOCRIT: 37.2 % (ref 36.0–46.0)
Hemoglobin: 12.4 g/dL (ref 12.0–15.0)
MCH: 28.6 pg (ref 26.0–34.0)
MCHC: 33.3 g/dL (ref 30.0–36.0)
MCV: 85.7 fL (ref 78.0–100.0)
Platelets: 148 10*3/uL — ABNORMAL LOW (ref 150–400)
RBC: 4.34 MIL/uL (ref 3.87–5.11)
RDW: 13.1 % (ref 11.5–15.5)
WBC: 8 10*3/uL (ref 4.0–10.5)

## 2015-12-31 LAB — BASIC METABOLIC PANEL
ANION GAP: 11 (ref 5–15)
BUN: 16 mg/dL (ref 6–20)
CHLORIDE: 97 mmol/L — AB (ref 101–111)
CO2: 29 mmol/L (ref 22–32)
Calcium: 9.4 mg/dL (ref 8.9–10.3)
Creatinine, Ser: 0.91 mg/dL (ref 0.44–1.00)
GFR calc Af Amer: >60 mL/min (ref >60)
GFR calc non Af Amer: >60 mL/min (ref >60)
GLUCOSE: 291 mg/dL — AB (ref 65–99)
POTASSIUM: 3.5 mmol/L (ref 3.5–5.1)
Sodium: 137 mmol/L (ref 135–145)

## 2015-12-31 LAB — ECHOCARDIOGRAM COMPLETE
Height: 68 in
Weight: 3104 oz

## 2015-12-31 LAB — HEMOGLOBIN A1C
Hgb A1c MFr Bld: 10 % — ABNORMAL HIGH (ref 4.8–5.6)
MEAN PLASMA GLUCOSE: 240 mg/dL

## 2015-12-31 LAB — GLUCOSE, CAPILLARY
GLUCOSE-CAPILLARY: 246 mg/dL — AB (ref 65–99)
Glucose-Capillary: 357 mg/dL — ABNORMAL HIGH (ref 65–99)

## 2015-12-31 MED ORDER — POTASSIUM CHLORIDE ER 20 MEQ PO TBCR: 20.0000 meq | EXTENDED_RELEASE_TABLET | Freq: Every day | ORAL | Status: AC

## 2015-12-31 NOTE — Consult Note (Signed)
Cardiology Consult    Patient ID: Kelsey Conley MRN: [id number], DOB/AGE: 25-Nov-1955   Admit date: 12/30/2015 Date of Consult: 12/31/2015  Primary Physician: Vena Austria, MD Reason for Consult: Chest Pain Primary Cardiologist: Dr. Gwenlyn Found Requesting Provider: Dr. Sloan Leiter   History of Present Illness    Kelsey Conley is a 60 y.o. female with past medical history of Type 2 DM, HTN, HLD, migraines, and neuropathy who presented to [facility] ED on 12/30/2015 for evaluation of chest pain.    The pain started earlier that morning while she was sitting in the exam room of her PCP's office. The pain was sharp and radiated into her back. It was a 6/10 initially but she was given SL NTG en route to the hospital which decreased the pain to a 3/10.  She reports the pain happens frequently, at least once per week, and is not associated with exertion. She says it is a stabbing pain that goes through the left-side of her chest to her back. She says this pain has occurred for years and yesterday's episode was less intense than previous ones. She has been seen at [facility] in the past for the pain but they said it was likely due to neuropathy. She has not been evaluated by a Cardiologist for the pain in the past 5+ years.  Her last cardiac catheterization was in 02/2009 which showed normal coronary arteries and normal LV function.It was thought her chest pain at that time was noncardiac and antireflux medication was recommended.  While in the ED, her initial troponin was negative. WBC 11.6. Hgb 13.4. Platelets 180. K+ 2.7. Glucose 341. Creatinine 0.96. CXR showed no active cardiopulmonary disease.  Since being admitted, her cyclic troponin values have been negative. A1c elevated to 10. Repeat BMET showing potassium improved to 3.5.  She denies any recurrent episodes of pain since being admitted. She voices concern about wanting to go home today due to caring for her husband and  grandchildren.   Past Medical History   Past Medical History  Diagnosis Date  . Diabetes mellitus without complication ([place])   . Hypertension   . Cancer [facility])     lymph node right neck  . Neuromuscular disorder (HCC)     neuropathy  . Migraines     Past Surgical History  Procedure Laterality Date  . Cholecystectomy    . Lower back surgery      L4, L5, L3  . Tubaligation    . Neck surgery       Allergies Allergies  Allergen Reactions  . Aspirin Nausea And Vomiting    Headaches and bad stomach cramps   . Codeine Nausea And Vomiting    Headache and bad stomach cramps    Inpatient Medications    . aspirin EC  325 mg Oral Daily  . DULoxetine  30 mg Oral BID  . enoxaparin (LOVENOX) injection  40 mg Subcutaneous Q24H  . gabapentin  1,200 mg Oral BID  . gi cocktail  30 mL Oral Once  . hydrochlorothiazide  12.5 mg Oral Daily  . insulin aspart  0-5 Units Subcutaneous QHS  . insulin aspart  0-9 Units Subcutaneous TID WC  . insulin detemir  90 Units Subcutaneous Daily  . irbesartan  150 mg Oral Daily  . levothyroxine  175 mcg Oral QAC breakfast  . Linaclotide  145 mcg Oral Daily  . pantoprazole  80 mg Oral Q1200  . pravastatin  40 mg Oral QHS  .  sodium chloride flush  3 mL Intravenous Q12H  . sodium chloride flush  3 mL Intravenous Q12H    Family History    Family History  Problem Relation Age of Onset  . Migraines Mother   . Crohn's disease Mother   . Hypertension Mother   . Stroke Father   . Migraines Sister   . Diabetes Sister   . Hypertension Sister   . Thyroid disease Sister   . Cancer Maternal Aunt   . Cancer Maternal Uncle   . Cancer Paternal Uncle     brain, lung, liver, stomach  . Cancer Maternal Grandmother   . Diabetes Maternal Grandmother   . Cancer Maternal Grandfather     prostate  . Heart attack Paternal Grandmother     Occurred in her 62's.     Social History    Social History   Social History  . Marital Status: Married    Spouse  Name: N/A  . Number of Children: N/A  . Years of Education: N/A   Occupational History  . Not on file.   Social History Main Topics  . Smoking status: Current Every Day Smoker -- 1.00 packs/day for 35 years    Types: Cigarettes  . Smokeless tobacco: Not on file  . Alcohol Use: No  . Drug Use: No  . Sexual Activity: Yes   Other Topics Concern  . Not on file   Social History Narrative     Review of Systems    General:  No chills, fever, night sweats or weight changes.  Cardiovascular:  No dyspnea on exertion, edema, orthopnea, palpitations, paroxysmal nocturnal dyspnea. Positive for shooting chest pain. Dermatological: No rash, lesions/masses Respiratory: No cough, dyspnea Urologic: No hematuria, dysuria Abdominal:   No nausea, vomiting, diarrhea, bright red blood per rectum, melena, or hematemesis Neurologic:  No visual changes, wkns, changes in mental status. All other systems reviewed and are otherwise negative except as noted above.  Physical Exam    Blood pressure 119/64, pulse 66, temperature 98.1 F (36.7 C), temperature source Oral, resp. rate 18, height 5\' 8"  (1.727 m), weight 194 lb (87.998 kg), SpO2 95 %.  General: Pleasant, NAD Psych: Normal affect. Neuro: Alert and oriented X 3. Moves all extremities spontaneously. HEENT: Normal  Neck: Supple without bruits or JVD. Lungs:  Resp regular and unlabored, CTA without wheezing or rales. Heart: RRR no s3, s4, or murmurs. Abdomen: Soft, non-tender, non-distended, BS + x 4.  Extremities: No clubbing, cyanosis or edema. DP/PT/Radials 2+ and equal bilaterally.  Labs    Troponin [facility] Care Test)  Recent Labs  12/30/15 1152  TROPIPOC 0.00    Recent Labs  12/30/15 1442  TROPONINI <0.03  <0.03   Lab Results  Component Value Date   WBC 8.0 12/31/2015   HGB 12.4 12/31/2015   HCT 37.2 12/31/2015   MCV 85.7 12/31/2015   PLT 148* 12/31/2015     Recent Labs Lab 12/31/15 0250  NA 137  K 3.5  CL 97*   CO2 29  BUN 16  CREATININE 0.91  CALCIUM 9.4  GLUCOSE 291*    Radiology Studies    Dg Chest 2 View: 12/30/2015  CLINICAL DATA:  Hyperglycemia today.  Chest pain. EXAM: CHEST  2 VIEW COMPARISON:  Portable examinations 02/01/2012 and 12/13/2009. FINDINGS: Suboptimal inspiration on the lateral view. The heart size and mediastinal contours are normal. The lungs are clear. There is no pleural effusion or pneumothorax. No acute osseous findings are identified. IMPRESSION: No  active cardiopulmonary process. Electronically Signed   By: Richardean Sale M.D.   On: 12/30/2015 11:43    EKG & Cardiac Imaging    EKG: NSR, HR 68. Nonspecific T-wave abnormality in the anterior leads.  Echocardiogram: Pending  CARDIAC CATHETERIZATION: 02/2009 DESCRIPTION OF PROCEDURE: The patient was brought to the Second [facility] Cardiac Cath Lab in a postabsorptive state. She was premedicated with p.o. Valium. Her right groin was prepped and shaved in the usual sterile fashion. Xylocaine 1% was used for local anesthesia. A 6-French sheath was inserted into the right femoral artery using standard Seldinger technique. A 6-French right and left Judkins diagnostic catheter as well as a 6-French pigtail catheter were used for selective coronary angiography and left ventriculography respectively. Visipaque dye was used for the entirety of the case. Retrograde aortic, left ventricular and pullback blood pressures were recorded.  HEMODYNAMICS: 1. Aortic systolic pressure [id number] and diastolic 68. 2. Left ventricular systolic pressure 123456, end-diastolic pressure 12.  SELECTIVE CORONARY ANGIOGRAPHY: 1. Left main normal. 2. LAD normal. 3. Left circumflex normal. 4. Right coronary artery is dominant normal.  Left ventriculography; RAO left ventriculogram was performed using 25 mL of Visipaque dye at 12 mL per second. The overall LVEF was estimated greater than 60% without focal  wall motion abnormalities.  IMPRESSION: Ms. Abid has essentially normal coronary arteries and normal LV function. I believe her chest pain is noncardiac. Antireflux therapy will be recommended.  The sheath was removed and pressure was held in the groin to achieve hemostasis. The patient left the lab in stable condition. She will be discharged home later today as an outpatient. Will see me back in the office in 1-2 weeks for followup.  Quay Burow, M.D. Electronically Signed   Assessment & Plan     1. Chest Pain with Atypical Features - reports having shooting chest pain 1-2 times weekly for the past several years. She was at her PCP's office when this occurred yesterday and they recommended she seek medical evaluation. No exertional component noted. Reports her chest is usually "sore" after the shooting pain. Was slightly relieved with SL NTG. - cyclic troponin values have been negative. EKG shows slight T-wave abnormality in the anterior leads.  - does have several cardiac risk factors including Type 2 DM, HTN, HLD, and tobacco abuse. - echocardiogram is pending. If no significant abnormalities, would recommend outpatient Lexiscan Myoview to further assess for ischemia since her last ischemic evaluation was a catheterization in 2010.  - consider addition of low-dose Imdur to help with her recurrent pain.   2. HTN - BP has been 101/53 - 140/81 while admitted - continue current medication regimen  3. HLD - continue statin therapy  4. Insulin Dependent Diabetes Mellitus - A1c 10.0 this admission.  - per admitting team  5. Tobacco Abuse - reports continuing to smoke due to stressors at home in regards to dealing with her husband who has substance dependent issues along with raising two of her grandchildren.  - smoking cessation advised.   Signed, Erma Heritage, PA-C 12/31/2015, 11:51 AM Pager: [phone] As above, patient seen and examined.  Recently she is a 60 year old female with past medical history of diabetes mellitus, hypertension, hyperlipidemia and peripheral neuropathy, tobacco abuse for evaluation of chest pain. She has had intermittent chest pain for years. Cardiac catheterization in 2010 showed normal coronary arteries. She was at her doctor yesterday and developed pain similar to previous episodes. The pain is substernal radiating to her back. It  is described as a pressure with a sharp pain in her back. The pain is not pleuritic, positional, exertional or related to food. Some dyspnea but no nausea or diaphoresis. Lasted 5 minutes and resolved. Enzymes are negative. Initial electrocardiogram showed sinus rhythm, slight anterior T-wave inversion and prolonged QT. Note her initial potassium was 2.7. Follow-up electrocardiogram showed sinus rhythm, nonspecific ST changes and improved QT interval. Chest pain is atypical. Echocardiogram has been ordered. If normal wall motion patient can be discharged with outpatient [place] nuclear study for risk stratification. She will need her potassium checked 1 week after discharge. Patient counseled on discontinuing tobacco use. Follow-up Dr. Gwenlyn Found. Follow-up primary care for diabetes mellitus. Kirk Ruths

## 2015-12-31 NOTE — Progress Notes (Signed)
Patient d/c home with family left floor via wheelchair accompanied by staff no c/o pain or shortness of breath. D/C instructions/ RXs reviewed/provided to  patient verbalized understanding.  Milayna Rotenberg, Tivis Ringer, RN

## 2015-12-31 NOTE — Discharge Summary (Signed)
PATIENT DETAILS Name: Kelsey Conley Age: 60 y.o. Sex: female Date of Birth: Aug 21, 1956 MRN: [id number]. Admitting Physician: Waldemar Dickens, MD [id number] ALEXANDER, MD  Admit Date: 12/30/2015 Discharge date: 12/31/2015  Recommendations for Outpatient Follow-up:  1. Ensure follow-up with cardiology for outpatient nuclear stress test.  2. Had hypokalemia-please repeat chemistry panel at next visit.   PRIMARY DISCHARGE DIAGNOSIS:  Principal Problem:   Chest pain Active Problems:   Diabetes mellitus with complication (HCC)   Essential hypertension   IBS (irritable bowel syndrome)   GERD (gastroesophageal reflux disease)   Hypothyroid   Depression   Peripheral neuropathy (HCC)   Tobacco abuse      PAST MEDICAL HISTORY: Past Medical History  Diagnosis Date  . Diabetes mellitus without complication ([place])   . Hypertension   . Cancer [facility])     lymph node right neck  . Neuromuscular disorder (HCC)     neuropathy  . Migraines     DISCHARGE MEDICATIONS: Current Discharge Medication List    START taking these medications   Details  potassium chloride 20 MEQ TBCR Take 20 mEq by mouth daily. Qty: 3 tablet, Refills: 0      CONTINUE these medications which have NOT CHANGED   Details  DULoxetine (CYMBALTA) 30 MG capsule Take 30 mg by mouth 2 (two) times daily.    esomeprazole (NEXIUM) 40 MG capsule Take 40 mg by mouth 2 (two) times daily.    gabapentin (NEURONTIN) 600 MG tablet Take 1,200 mg by mouth 2 (two) times daily.     glimepiride (AMARYL) 4 MG tablet Take 4 mg by mouth 2 (two) times daily.    insulin detemir (LEVEMIR) 100 UNIT/ML injection Inject 90 Units into the skin daily.     levothyroxine (SYNTHROID, LEVOTHROID) 175 MCG tablet Take 175 mcg by mouth daily before breakfast.    LINZESS 145 MCG CAPS capsule Take 145 mg by mouth daily.    metFORMIN (GLUCOPHAGE) 1000 MG tablet Take 1,000 mg by mouth 2 (two) times daily with a meal.    pravastatin  (PRAVACHOL) 40 MG tablet Take 40 mg by mouth at bedtime.    sitaGLIPtin (JANUVIA) 100 MG tablet Take 100 mg by mouth daily.    valsartan-hydrochlorothiazide (DIOVAN-HCT) 160-12.5 MG per tablet Take 1 tablet by mouth daily.        ALLERGIES:   Allergies  Allergen Reactions  . Aspirin Nausea And Vomiting    Headaches and bad stomach cramps   . Codeine Nausea And Vomiting    Headache and bad stomach cramps    BRIEF HPI:  See H&P, Labs, Consult and Test reports for all details in brief, patient was admitted for Evaluation of chest pain.  CONSULTATIONS:   cardiology  PERTINENT RADIOLOGIC STUDIES: Dg Chest 2 View  12/30/2015  CLINICAL DATA:  Hyperglycemia today.  Chest pain. EXAM: CHEST  2 VIEW COMPARISON:  Portable examinations 02/01/2012 and 12/13/2009. FINDINGS: Suboptimal inspiration on the lateral view. The heart size and mediastinal contours are normal. The lungs are clear. There is no pleural effusion or pneumothorax. No acute osseous findings are identified. IMPRESSION: No active cardiopulmonary process. Electronically Signed   By: Richardean Sale M.D.   On: 12/30/2015 11:43     PERTINENT LAB RESULTS: CBC:  Recent Labs  12/30/15 1141 12/31/15 0250  WBC 11.6* 8.0  HGB 13.4 12.4  HCT 38.4 37.2  PLT 180 148*   CMET CMP     Component Value Date/Time   NA 137 12/31/2015 0250  NA 139 07/20/2013 1251   K 3.5 12/31/2015 0250   CL 97* 12/31/2015 0250   CO2 29 12/31/2015 0250   GLUCOSE 291* 12/31/2015 0250   GLUCOSE 172* 07/20/2013 1251   BUN 16 12/31/2015 0250   BUN 12 07/20/2013 1251   CREATININE 0.91 12/31/2015 0250   CALCIUM 9.4 12/31/2015 0250   GFRNONAA >60 12/31/2015 0250   GFRAA >60 12/31/2015 0250    GFR Estimated Creatinine Clearance: 78.2 mL/min (by C-G formula based on Cr of 0.91). No results for input(s): LIPASE, AMYLASE in the last 72 hours.  Recent Labs  12/30/15 1442  TROPONINI <0.03  <0.03   Invalid input(s): POCBNP No results for  input(s): DDIMER in the last 72 hours.  Recent Labs  12/30/15 1442  HGBA1C 10.0*   No results for input(s): CHOL, HDL, LDLCALC, TRIG, CHOLHDL, LDLDIRECT in the last 72 hours. No results for input(s): TSH, T4TOTAL, T3FREE, THYROIDAB in the last 72 hours.  Invalid input(s): FREET3 No results for input(s): VITAMINB12, FOLATE, FERRITIN, TIBC, IRON, RETICCTPCT in the last 72 hours. Coags: No results for input(s): INR in the last 72 hours.  Invalid input(s): PT Microbiology: No results found for this or any previous visit (from the past 240 hour(s)).   BRIEF HOSPITAL COURSE:   Principal Problem: Chest pain: Mostly atypical. No further chest pain today, had multiple episodes of chest pain yesterday. EKG, cardiac enzymes were negative. 2-D echocardiogram without any wall motion abnormalities. Cardiology was consulted, plans are for outpatient nuclear stress test.  Active Problems: Hypokalemia: Likely secondary to HCTZ, will provide potassium supplementation for a few days on discharge. Please check repeat chemistry panel at next visit with PCP.  Diabetes mellitus with complication: Continue Lantus and her usual oral hypoglycemic agents.   Essential hypertension: Controlled, continue with Sultrin and HCTZ.  IBS (irritable bowel syndrome): Stable, continue Linzess.  GERD (gastroesophageal reflux disease): Continue PPI  Hypothyroid: Continue Synthroid  Tobacco abuse: Counseled regarding importance of quitting.  TODAY-DAY OF DISCHARGE:  Subjective:   Christelle Smolka today has no headache,no chest abdominal pain,no new weakness tingling or numbness, feels much better wants to go home today.   Objective:   Blood pressure 130/73, pulse 66, temperature 97.8 F (36.6 C), temperature source Oral, resp. rate 18, height 5\' 8"  (1.727 m), weight 87.998 kg (194 lb), SpO2 99 %.  Intake/Output Summary (Last 24 hours) at 12/31/15 1525 Last data filed at 12/31/15 1409  Gross per 24 hour  Intake     360 ml  Output      0 ml  Net    360 ml   Filed Weights   12/30/15 1051 12/30/15 1716  Weight: 87.998 kg (194 lb) 87.998 kg (194 lb)    Exam Awake Alert, Oriented *3, No new F.N deficits, Normal affect [place].AT,PERRAL Supple Neck,No JVD, No cervical lymphadenopathy appriciated.  Symmetrical Chest wall movement, Good air movement bilaterally, CTAB RRR,No Gallops,Rubs or new Murmurs, No Parasternal Heave +ve B.Sounds, Abd Soft, Non tender, No organomegaly appriciated, No rebound -guarding or rigidity. No Cyanosis, Clubbing or edema, No new Rash or bruise  DISCHARGE CONDITION: Stable  DISPOSITION: Home  DISCHARGE INSTRUCTIONS:    Activity:  As tolerated  Get Medicines reviewed and adjusted: Please take all your medications with you for your next visit with your Primary MD  Please request your Primary MD to go over all hospital tests and procedure/radiological results at the follow up, please ask your Primary MD to get all Hospital records sent to his/her  office.  If you experience worsening of your admission symptoms, develop shortness of breath, life threatening emergency, suicidal or homicidal thoughts you must seek medical attention immediately by calling 911 or calling your MD immediately  if symptoms less severe.  You must read complete instructions/literature along with all the possible adverse reactions/side effects for all the Medicines you take and that have been prescribed to you. Take any new Medicines after you have completely understood and accpet all the possible adverse reactions/side effects.   Do not drive when taking Pain medications.   Do not take more than prescribed Pain, Sleep and Anxiety Medications  Special Instructions: If you have smoked or chewed Tobacco  in the last 2 yrs please stop smoking, stop any regular Alcohol  and or any Recreational drug use.  Wear Seat belts while driving.  Please note  You were cared for by a hospitalist during your  hospital stay. Once you are discharged, your primary care physician will handle any further medical issues. Please note that NO REFILLS for any discharge medications will be authorized once you are discharged, as it is imperative that you return to your primary care physician (or establish a relationship with a primary care physician if you do not have one) for your aftercare needs so that they can reassess your need for medications and monitor your lab values.   Diet recommendation: Diabetic Diet Heart Healthy diet  Discharge Instructions    Call MD for:    Complete by:  As directed   Chest pain     Diet - low sodium heart healthy    Complete by:  As directed      Diet Carb Modified    Complete by:  As directed      Increase activity slowly    Complete by:  As directed            Follow-up Information    Follow up with [facility] On 01/07/2016.   Specialty:  Cardiology   Why:  Cardiology Nuclear Stress Test on 01/07/2016 at 7:45AM. Nothing to eat or drink after midnight leading up to the test excpet sips of water with medication.   Contact information:   [address] [place] [phone]      Follow up with Vena Austria, MD. Schedule an appointment as soon as possible for a visit in 1 week.   Specialty:  Family Medicine   Contact information:   [place] [address] [phone]      Total Time spent on discharge equals 25  minutes.  SignedOren Binet 12/31/2015 3:25 PM

## 2015-12-31 NOTE — Care Management Obs Status (Signed)
[place] NOTIFICATION   Patient Details  Name: JASMYNNE ZINKE MRN: [id number] Date of Birth: Aug 20, 1956   Medicare Observation Status Notification Given:  Yes    CrutchfieldAntony Haste, RN 12/31/2015, 2:33 PM

## 2015-12-31 NOTE — Progress Notes (Signed)
  Echocardiogram 2D Echocardiogram has been performed.  Kelsey Conley M 12/31/2015, 2:00 PM

## 2015-12-31 NOTE — Discharge Instructions (Signed)
You have a Stress Test scheduled at [facility] [place]. Your doctor has ordered this test to get a better idea of how your heart works.  Please arrive 15 minutes early for paperwork.   Location: [place] [id number]  Instructions:  No food/drink after midnight the night before.   No caffeine/decaf products 24 hours before, including medicines such as Excedrin or Goody Powders. Call if there are any questions.   Wear comfortable clothes and shoes.   It is OK to take your morning meds with a sip of water EXCEPT for those types of medicines listed below or otherwise instructed.  Special Medication Instructions:  Beta blockers such as metoprolol (Lopressor/Toprol XL), atenolol (Tenormin), carvedilol (Coreg), nebivolol (Bystolic), propranolol (Inderal) should not be taken for 24 hours before the test.  Calcium channel blockers such as diltiazem (Cardizem) or verapmil (Calan) should not be taken for 24 hours before the test.  Remove nitroglycerin patches and do not take nitrate preparations such as Imdur/isosorbide the day of your test.  No Persantine/Theophylline or Aggrenox medicines should be used within 24 hours of the test.   What To Expect: The whole test will take several hours. When you arrive in the lab, the technician will inject a small amount of radioactive tracer into your arm through an IV while you are resting quietly. This helps [place] to form pictures of your heart. You will likely only feel a sting from the IV. After a waiting period, resting pictures will be obtained under a big camera. These are the "before" pictures.  Next, you will be prepped for the stress portion of the test. This may include either walking on a treadmill or receiving a medicine that helps to dilate blood vessels in your heart to simulate the effect of exercise on your heart. If you are walking on a treadmill, you will walk at  different paces to try to get your heart rate to a goal number that is based on your age. If your doctor has chosen the pharmacologic test, then you will receive a medicine through your IV that may cause temporary nausea, flushing, shortness of breath and sometimes chest discomfort or vomiting. This is typically short-lived and usually resolves quickly. Your blood pressure and heart rate will be monitored, and we will be watching your EKG on a computer screen for any changes. During this portion of the test, the radiologist will inject another small amount of radioactive tracer into your IV. After a waiting period, you will undergo a second set of pictures. These are the "after" pictures.  The doctor reading the test will compare the before-and-after images to look for evidence of heart blockages or heart weakness. In certain instances, this test is done over 2 days but usually only takes 1 day to complete.

## 2016-01-05 ENCOUNTER — Telehealth (HOSPITAL_COMMUNITY): Payer: Self-pay | Admitting: *Deleted

## 2016-01-05 DIAGNOSIS — E1165 Type 2 diabetes mellitus with hyperglycemia: Secondary | ICD-10-CM | POA: Diagnosis not present

## 2016-01-05 DIAGNOSIS — E039 Hypothyroidism, unspecified: Secondary | ICD-10-CM | POA: Diagnosis not present

## 2016-01-05 DIAGNOSIS — Z7984 Long term (current) use of oral hypoglycemic drugs: Secondary | ICD-10-CM | POA: Diagnosis not present

## 2016-01-05 DIAGNOSIS — I10 Essential (primary) hypertension: Secondary | ICD-10-CM | POA: Diagnosis not present

## 2016-01-05 DIAGNOSIS — E876 Hypokalemia: Secondary | ICD-10-CM | POA: Diagnosis not present

## 2016-01-05 DIAGNOSIS — E1140 Type 2 diabetes mellitus with diabetic neuropathy, unspecified: Secondary | ICD-10-CM | POA: Diagnosis not present

## 2016-01-05 DIAGNOSIS — K581 Irritable bowel syndrome with constipation: Secondary | ICD-10-CM | POA: Diagnosis not present

## 2016-01-05 DIAGNOSIS — K219 Gastro-esophageal reflux disease without esophagitis: Secondary | ICD-10-CM | POA: Diagnosis not present

## 2016-01-05 DIAGNOSIS — E782 Mixed hyperlipidemia: Secondary | ICD-10-CM | POA: Diagnosis not present

## 2016-01-05 DIAGNOSIS — R079 Chest pain, unspecified: Secondary | ICD-10-CM | POA: Diagnosis not present

## 2016-01-05 DIAGNOSIS — Z794 Long term (current) use of insulin: Secondary | ICD-10-CM | POA: Diagnosis not present

## 2016-01-05 NOTE — Telephone Encounter (Signed)
Left message on voicemail in reference to upcoming appointment scheduled for 01/07/16. Phone number given for a call back so details instructions can be given. Kelsey Conley W   

## 2016-01-06 ENCOUNTER — Telehealth (HOSPITAL_COMMUNITY): Payer: Self-pay | Admitting: *Deleted

## 2016-01-06 NOTE — Telephone Encounter (Signed)
Patient given detailed instructions per Myocardial Perfusion Study Information Sheet for the test on 01/07/16 at 0745. Patient notified to arrive 15 minutes early and that it is imperative to arrive on time for appointment to keep from having the test rescheduled.  If you need to cancel or reschedule your appointment, please call the office within 24 hours of your appointment. Failure to do so may result in a cancellation of your appointment, and a $50 no show fee. Patient verbalized understanding.Kelsey Conley, Ranae [place]

## 2016-01-07 ENCOUNTER — Ambulatory Visit (HOSPITAL_COMMUNITY): Payer: Medicare Other | Attending: Cardiology

## 2016-01-07 DIAGNOSIS — E109 Type 1 diabetes mellitus without complications: Secondary | ICD-10-CM | POA: Diagnosis not present

## 2016-01-07 DIAGNOSIS — R0789 Other chest pain: Secondary | ICD-10-CM

## 2016-01-07 DIAGNOSIS — R079 Chest pain, unspecified: Secondary | ICD-10-CM | POA: Diagnosis not present

## 2016-01-07 DIAGNOSIS — I10 Essential (primary) hypertension: Secondary | ICD-10-CM | POA: Diagnosis not present

## 2016-01-07 DIAGNOSIS — R0602 Shortness of breath: Secondary | ICD-10-CM

## 2016-01-07 LAB — MYOCARDIAL PERFUSION IMAGING
CHL CUP NUCLEAR SDS: 0
CHL CUP NUCLEAR SRS: 5
CHL CUP NUCLEAR SSS: 5
CHL CUP STRESS STAGE 1 DBP: 51 mmHg
CHL CUP STRESS STAGE 1 HR: 62 {beats}/min
CHL CUP STRESS STAGE 1 SBP: 103 mmHg
CHL CUP STRESS STAGE 2 SPEED: 0 mph
CHL CUP STRESS STAGE 3 DBP: 60 mmHg
CHL CUP STRESS STAGE 3 GRADE: 0 %
CHL CUP STRESS STAGE 3 SPEED: 0 mph
CHL CUP STRESS STAGE 4 HR: 86 {beats}/min
CHL CUP STRESS STAGE 5 HR: 85 {beats}/min
CHL CUP STRESS STAGE 5 SBP: 108 mmHg
CHL CUP STRESS STAGE 5 SPEED: 0 mph
CHL CUP STRESS STAGE 6 HR: 74 {beats}/min
CHL CUP STRESS STAGE 6 SBP: 106 mmHg
CSEPPMHR: 53 %
Estimated workload: 1 METS
LHR: 0.31
LV sys vol: 47 mL
LVDIAVOL: 101 mL (ref 46–106)
Peak HR: 86 {beats}/min
Rest HR: 62 {beats}/min
Stage 1 Grade: 0 %
Stage 1 Speed: 0 mph
Stage 2 Grade: 0 %
Stage 2 HR: 61 {beats}/min
Stage 3 HR: 77 {beats}/min
Stage 3 SBP: 108 mmHg
Stage 4 Grade: 0 %
Stage 4 Speed: 0 mph
Stage 5 DBP: 68 mmHg
Stage 5 Grade: 0 %
Stage 6 DBP: 63 mmHg
Stage 6 Grade: 0 %
Stage 6 Speed: 0 mph
TID: 0.91

## 2016-01-07 MED ORDER — TECHNETIUM TC 99M SESTAMIBI GENERIC - CARDIOLITE
32.7000 | Freq: Once | INTRAVENOUS | Status: AC | PRN
  Administered 2016-01-07: 32.7 via INTRAVENOUS

## 2016-01-07 MED ORDER — AMINOPHYLLINE 25 MG/ML IV SOLN
75.0000 mg | Freq: Once | INTRAVENOUS | Status: AC
  Administered 2016-01-07: 75 mg via INTRAVENOUS

## 2016-01-07 MED ORDER — TECHNETIUM TC 99M SESTAMIBI GENERIC - CARDIOLITE
10.2000 | Freq: Once | INTRAVENOUS | Status: AC | PRN
  Administered 2016-01-07: 10 via INTRAVENOUS

## 2016-01-07 MED ORDER — REGADENOSON 0.4 MG/5ML IV SOLN
0.4000 mg | Freq: Once | INTRAVENOUS | Status: AC
  Administered 2016-01-07: 0.4 mg via INTRAVENOUS

## 2016-04-06 DIAGNOSIS — E1140 Type 2 diabetes mellitus with diabetic neuropathy, unspecified: Secondary | ICD-10-CM | POA: Diagnosis not present

## 2016-04-06 DIAGNOSIS — E1165 Type 2 diabetes mellitus with hyperglycemia: Secondary | ICD-10-CM | POA: Diagnosis not present

## 2016-04-06 DIAGNOSIS — E782 Mixed hyperlipidemia: Secondary | ICD-10-CM | POA: Diagnosis not present

## 2016-04-06 DIAGNOSIS — E1139 Type 2 diabetes mellitus with other diabetic ophthalmic complication: Secondary | ICD-10-CM | POA: Diagnosis not present

## 2016-04-06 DIAGNOSIS — K219 Gastro-esophageal reflux disease without esophagitis: Secondary | ICD-10-CM | POA: Diagnosis not present

## 2016-04-06 DIAGNOSIS — Z7984 Long term (current) use of oral hypoglycemic drugs: Secondary | ICD-10-CM | POA: Diagnosis not present

## 2016-04-06 DIAGNOSIS — R609 Edema, unspecified: Secondary | ICD-10-CM | POA: Diagnosis not present

## 2016-04-06 DIAGNOSIS — I10 Essential (primary) hypertension: Secondary | ICD-10-CM | POA: Diagnosis not present

## 2016-04-06 DIAGNOSIS — E039 Hypothyroidism, unspecified: Secondary | ICD-10-CM | POA: Diagnosis not present

## 2016-04-06 DIAGNOSIS — Z794 Long term (current) use of insulin: Secondary | ICD-10-CM | POA: Diagnosis not present

## 2016-04-06 DIAGNOSIS — E1142 Type 2 diabetes mellitus with diabetic polyneuropathy: Secondary | ICD-10-CM | POA: Diagnosis not present

## 2016-04-06 DIAGNOSIS — E7800 Pure hypercholesterolemia, unspecified: Secondary | ICD-10-CM | POA: Diagnosis not present

## 2016-05-08 IMAGING — CT CT ABD-PELV W/ CM
1 of 3 series · 13 of 32 positions shown, 18 images · IV contrast (APPLIED)
Comparison: CT abdomen pelvis of 05/17/2014

CLINICAL DATA: Diffuse abdominal pain and bloating, and nausea,
history prior uterine carcinoma in [id number]

EXAM:
CT ABDOMEN AND PELVIS WITH CONTRAST
TECHNIQUE: Multidetector CT imaging of the abdomen and pelvis was performed
using the standard protocol following bolus administration of
intravenous contrast.
CONTRAST:  100mL OMNIPAQUE IOHEXOL 300 MG/ML  SOLN

[Series 2: abd pelvis 5.0 i41s 1 · axial · 0.82mm/px · z∈[-488,-13]mm · 13 of 107 slices shown, 18 images]
[im 6/107  soft-tissue]
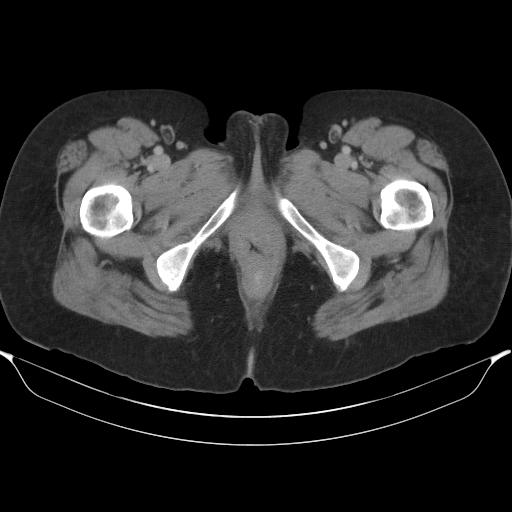
[im 6/107  bone]
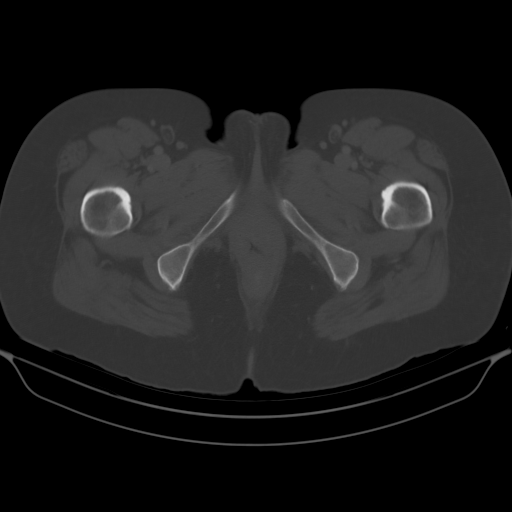
[im 16/107  soft-tissue]
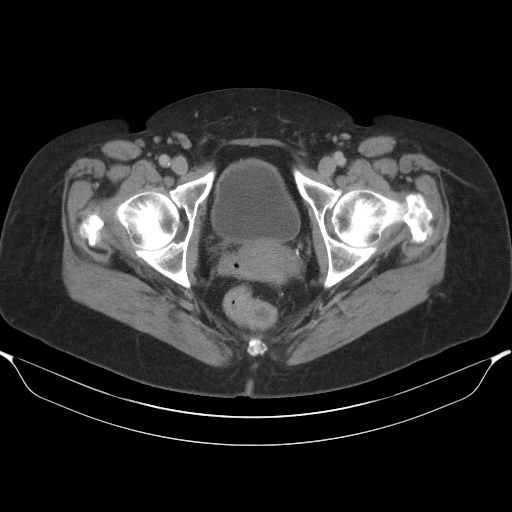
[im 22/107  soft-tissue]
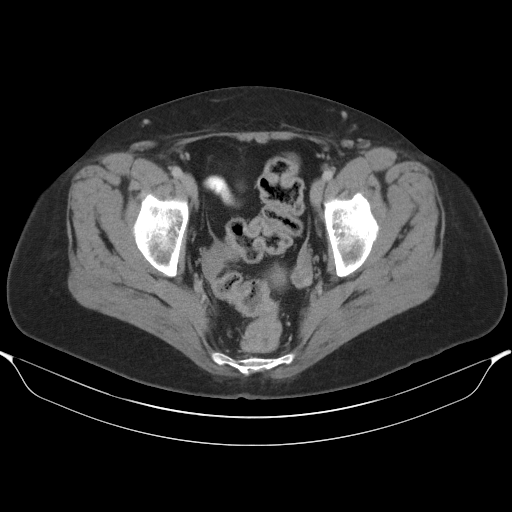
[im 32/107  soft-tissue]
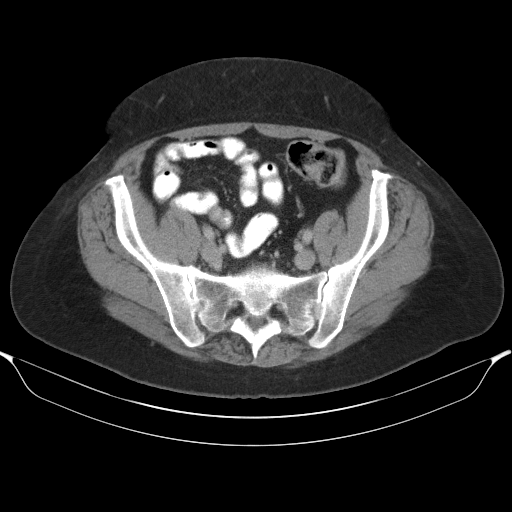
[im 43/107  soft-tissue]
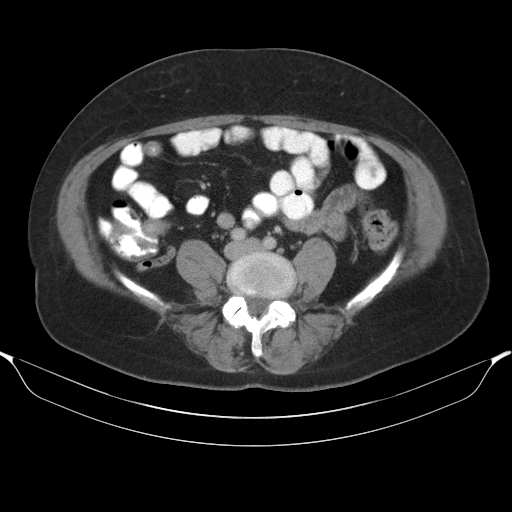
[im 48/107  soft-tissue]
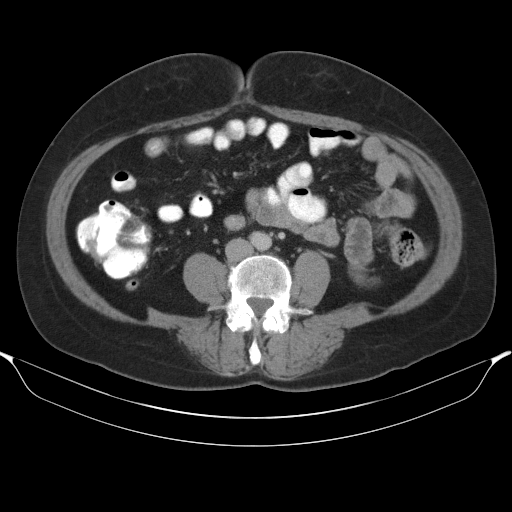
[im 59/107  soft-tissue]
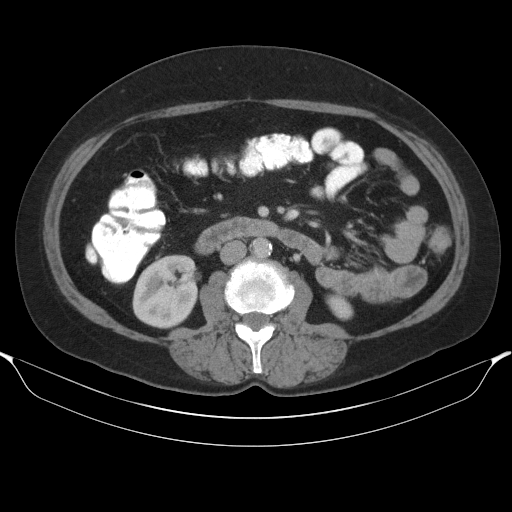
[im 64/107  soft-tissue]
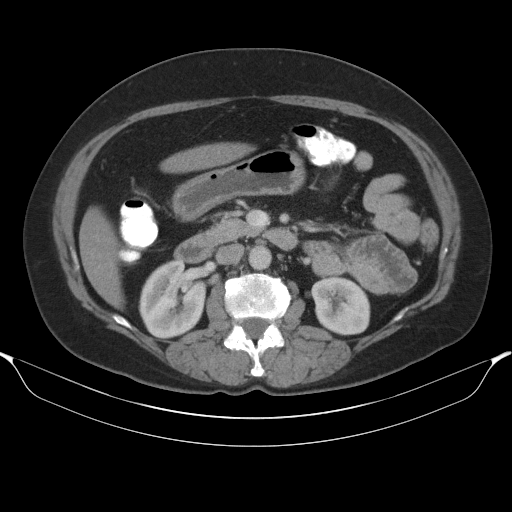
[im 75/107  soft-tissue]
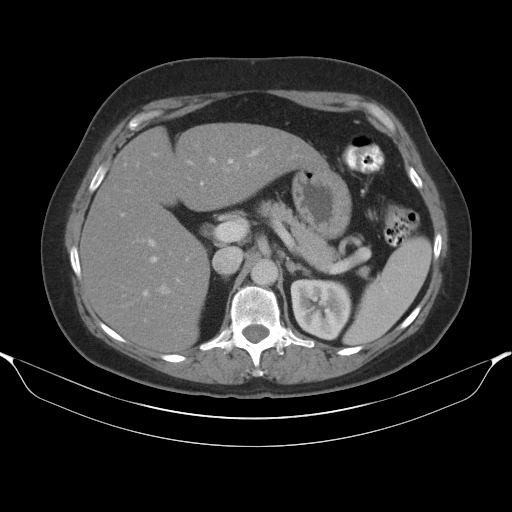
[im 75/107  bone]
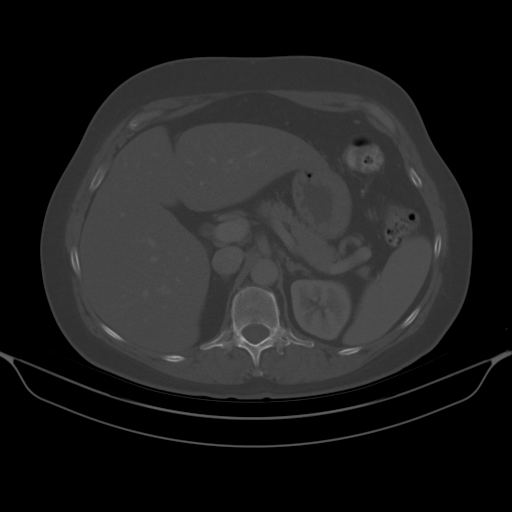
[im 85/107  soft-tissue]
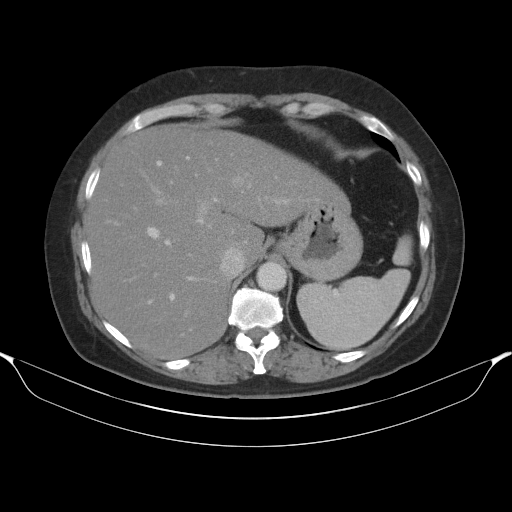
[im 85/107  lung]
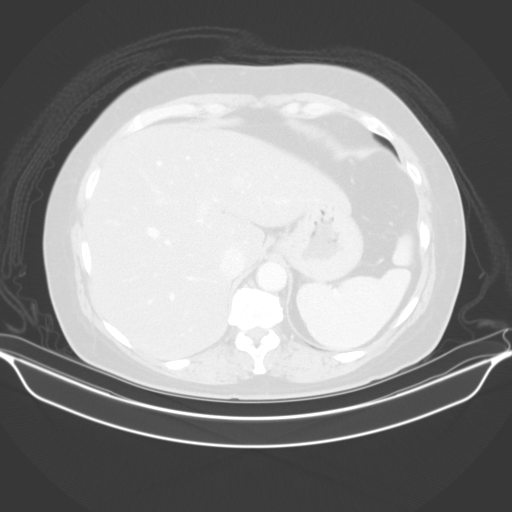
[im 91/107  soft-tissue]
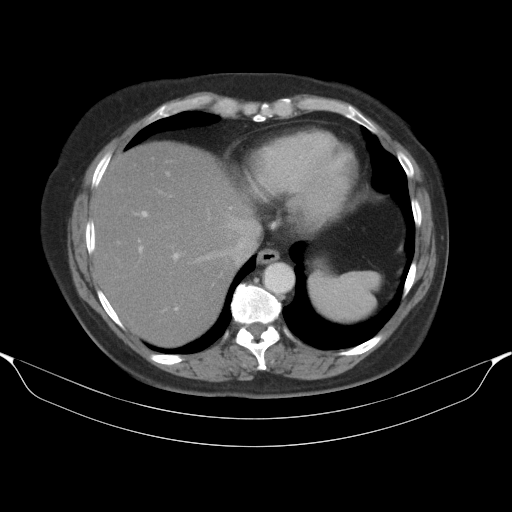
[im 91/107  lung]
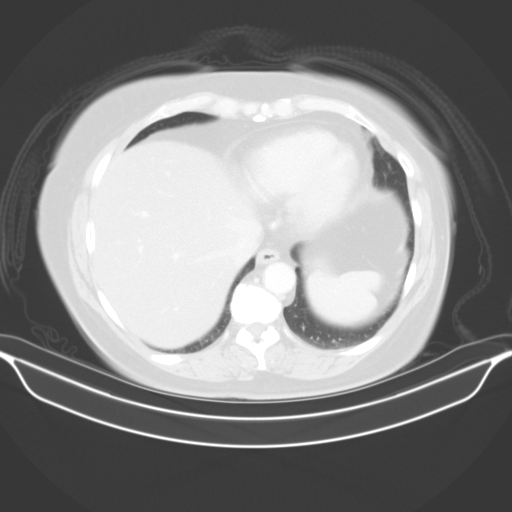
[im 96/107  lung]
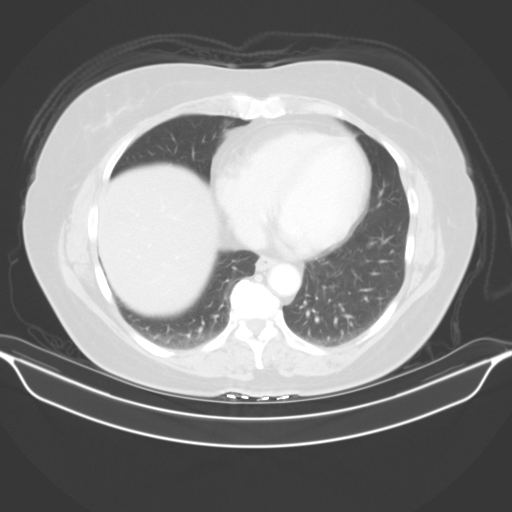
[im 101/107  soft-tissue]
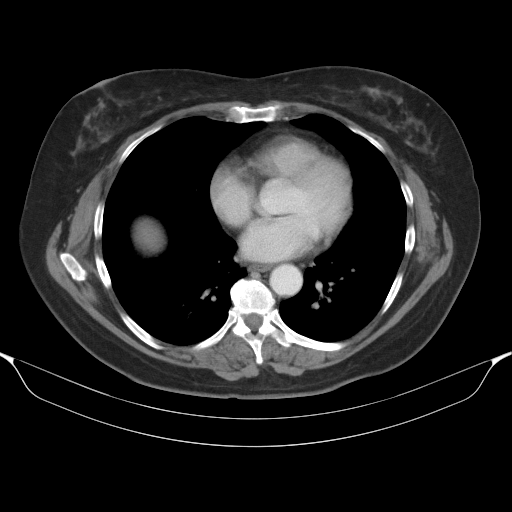
[im 101/107  lung]
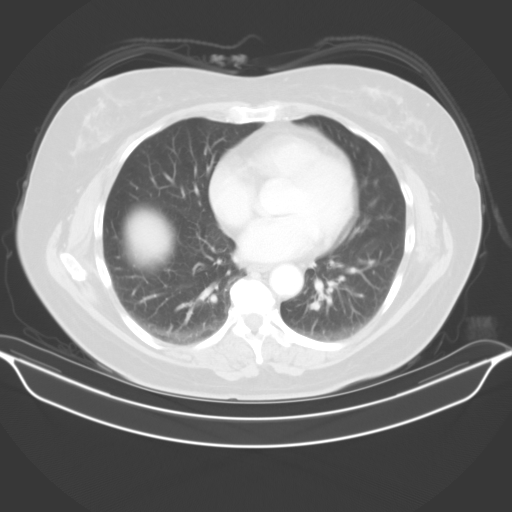

[13 of 32 positions shown; findings below may reference images not displayed]

FINDINGS: The lung bases are clear. The liver is diffusely low in attenuation
consistent with fatty infiltration as described previously. No focal
hepatic abnormality is seen. Surgical clips are noted from prior
cholecystectomy. The pancreas is normal in size and the pancreatic
duct is not dilated. A right adrenal adenoma is stable and the left
adrenal gland is normal in size. The spleen is unchanged. The
stomach is decompressed. The kidneys enhance with no calculus or
mass and on delayed images, the pelvocaliceal systems appear normal
and the proximal ureters are normal in caliber. The abdominal aorta
is normal in caliber with moderate atheromatous change present.

The appendix is well seen in the right abdomen and fills with air
normally. The terminal ileum also is unremarkable. The urinary
bladder is not well distended but no abnormality is seen. The uterus
appears stable in size, and there do appear to be bilateral ovarian
follicles, possibly a collapsing follicle on the left. Dense
calcification a 60 with the right ovary again is noted. Some free
fluid is noted in the right adnexa. The colon is largely
decompressed but no significant abnormality is seen. No distention
of large or small bowel is noted. The lumbar vertebrae are in normal
alignment with normal disc spaces.
IMPRESSION: 1. No explanation for the patient's abdominal pain is seen.
2. The appendix and terminal ileum are unremarkable.
3. No edema or distension of large or small bowel is noted.
4. Diffuse fatty infiltration of the liver.
5. Probable ovarian follicles bilaterally with a possible collapsing
follicle on the left.

## 2016-07-22 DIAGNOSIS — R197 Diarrhea, unspecified: Secondary | ICD-10-CM | POA: Diagnosis not present

## 2016-07-22 DIAGNOSIS — R609 Edema, unspecified: Secondary | ICD-10-CM | POA: Diagnosis not present

## 2016-07-22 DIAGNOSIS — Z23 Encounter for immunization: Secondary | ICD-10-CM | POA: Diagnosis not present

## 2016-07-22 DIAGNOSIS — R296 Repeated falls: Secondary | ICD-10-CM | POA: Diagnosis not present

## 2016-07-22 DIAGNOSIS — R21 Rash and other nonspecific skin eruption: Secondary | ICD-10-CM | POA: Diagnosis not present

## 2016-07-22 DIAGNOSIS — R635 Abnormal weight gain: Secondary | ICD-10-CM | POA: Diagnosis not present

## 2016-07-22 DIAGNOSIS — Z794 Long term (current) use of insulin: Secondary | ICD-10-CM | POA: Diagnosis not present

## 2016-07-22 DIAGNOSIS — R112 Nausea with vomiting, unspecified: Secondary | ICD-10-CM | POA: Diagnosis not present

## 2016-07-22 DIAGNOSIS — E1165 Type 2 diabetes mellitus with hyperglycemia: Secondary | ICD-10-CM | POA: Diagnosis not present

## 2016-07-22 DIAGNOSIS — Z79899 Other long term (current) drug therapy: Secondary | ICD-10-CM | POA: Diagnosis not present

## 2016-08-18 DIAGNOSIS — Z6829 Body mass index (BMI) 29.0-29.9, adult: Secondary | ICD-10-CM | POA: Diagnosis not present

## 2016-08-18 DIAGNOSIS — E1165 Type 2 diabetes mellitus with hyperglycemia: Secondary | ICD-10-CM | POA: Diagnosis not present

## 2016-08-18 DIAGNOSIS — Z794 Long term (current) use of insulin: Secondary | ICD-10-CM | POA: Diagnosis not present

## 2016-08-18 DIAGNOSIS — R635 Abnormal weight gain: Secondary | ICD-10-CM | POA: Diagnosis not present

## 2016-08-26 DIAGNOSIS — E1165 Type 2 diabetes mellitus with hyperglycemia: Secondary | ICD-10-CM | POA: Diagnosis not present

## 2016-08-26 DIAGNOSIS — E1140 Type 2 diabetes mellitus with diabetic neuropathy, unspecified: Secondary | ICD-10-CM | POA: Diagnosis not present

## 2016-08-26 DIAGNOSIS — Z794 Long term (current) use of insulin: Secondary | ICD-10-CM | POA: Diagnosis not present

## 2016-08-26 DIAGNOSIS — R635 Abnormal weight gain: Secondary | ICD-10-CM | POA: Diagnosis not present

## 2016-08-31 ENCOUNTER — Ambulatory Visit: Payer: Medicare Other | Attending: Internal Medicine | Admitting: Physical Therapy

## 2016-09-08 DIAGNOSIS — J069 Acute upper respiratory infection, unspecified: Secondary | ICD-10-CM | POA: Diagnosis not present

## 2016-09-08 DIAGNOSIS — R05 Cough: Secondary | ICD-10-CM | POA: Diagnosis not present

## 2016-09-08 DIAGNOSIS — J441 Chronic obstructive pulmonary disease with (acute) exacerbation: Secondary | ICD-10-CM | POA: Diagnosis not present

## 2016-09-08 DIAGNOSIS — F17200 Nicotine dependence, unspecified, uncomplicated: Secondary | ICD-10-CM | POA: Diagnosis not present

## 2016-10-07 DIAGNOSIS — E1142 Type 2 diabetes mellitus with diabetic polyneuropathy: Secondary | ICD-10-CM | POA: Diagnosis not present

## 2016-10-07 DIAGNOSIS — R609 Edema, unspecified: Secondary | ICD-10-CM | POA: Diagnosis not present

## 2016-10-07 DIAGNOSIS — E039 Hypothyroidism, unspecified: Secondary | ICD-10-CM | POA: Diagnosis not present

## 2016-10-07 DIAGNOSIS — K219 Gastro-esophageal reflux disease without esophagitis: Secondary | ICD-10-CM | POA: Diagnosis not present

## 2016-10-07 DIAGNOSIS — E1139 Type 2 diabetes mellitus with other diabetic ophthalmic complication: Secondary | ICD-10-CM | POA: Diagnosis not present

## 2016-10-07 DIAGNOSIS — Z1389 Encounter for screening for other disorder: Secondary | ICD-10-CM | POA: Diagnosis not present

## 2016-10-07 DIAGNOSIS — Z23 Encounter for immunization: Secondary | ICD-10-CM | POA: Diagnosis not present

## 2016-10-07 DIAGNOSIS — Z0000 Encounter for general adult medical examination without abnormal findings: Secondary | ICD-10-CM | POA: Diagnosis not present

## 2016-10-07 DIAGNOSIS — E7800 Pure hypercholesterolemia, unspecified: Secondary | ICD-10-CM | POA: Diagnosis not present

## 2016-10-07 DIAGNOSIS — I10 Essential (primary) hypertension: Secondary | ICD-10-CM | POA: Diagnosis not present

## 2016-10-07 DIAGNOSIS — E1165 Type 2 diabetes mellitus with hyperglycemia: Secondary | ICD-10-CM | POA: Diagnosis not present

## 2016-10-07 DIAGNOSIS — E1140 Type 2 diabetes mellitus with diabetic neuropathy, unspecified: Secondary | ICD-10-CM | POA: Diagnosis not present

## 2016-10-12 DIAGNOSIS — J9801 Acute bronchospasm: Secondary | ICD-10-CM | POA: Diagnosis not present

## 2016-11-16 DIAGNOSIS — E1165 Type 2 diabetes mellitus with hyperglycemia: Secondary | ICD-10-CM | POA: Diagnosis not present

## 2016-11-16 DIAGNOSIS — Z79899 Other long term (current) drug therapy: Secondary | ICD-10-CM | POA: Diagnosis not present

## 2016-11-16 DIAGNOSIS — Z5181 Encounter for therapeutic drug level monitoring: Secondary | ICD-10-CM | POA: Diagnosis not present

## 2016-11-16 DIAGNOSIS — Z794 Long term (current) use of insulin: Secondary | ICD-10-CM | POA: Diagnosis not present

## 2016-11-16 DIAGNOSIS — E1140 Type 2 diabetes mellitus with diabetic neuropathy, unspecified: Secondary | ICD-10-CM | POA: Diagnosis not present

## 2017-01-19 DIAGNOSIS — M79672 Pain in left foot: Secondary | ICD-10-CM | POA: Diagnosis not present

## 2017-01-19 DIAGNOSIS — M79671 Pain in right foot: Secondary | ICD-10-CM | POA: Diagnosis not present

## 2017-01-19 DIAGNOSIS — L853 Xerosis cutis: Secondary | ICD-10-CM | POA: Diagnosis not present

## 2017-01-25 DIAGNOSIS — E1165 Type 2 diabetes mellitus with hyperglycemia: Secondary | ICD-10-CM | POA: Diagnosis not present

## 2017-01-25 DIAGNOSIS — E1140 Type 2 diabetes mellitus with diabetic neuropathy, unspecified: Secondary | ICD-10-CM | POA: Diagnosis not present

## 2017-01-25 DIAGNOSIS — Z794 Long term (current) use of insulin: Secondary | ICD-10-CM | POA: Diagnosis not present

## 2017-01-25 DIAGNOSIS — Z5181 Encounter for therapeutic drug level monitoring: Secondary | ICD-10-CM | POA: Diagnosis not present

## 2017-01-26 DIAGNOSIS — Z794 Long term (current) use of insulin: Secondary | ICD-10-CM | POA: Diagnosis not present

## 2017-01-26 DIAGNOSIS — Z1231 Encounter for screening mammogram for malignant neoplasm of breast: Secondary | ICD-10-CM | POA: Diagnosis not present

## 2017-01-26 DIAGNOSIS — E1165 Type 2 diabetes mellitus with hyperglycemia: Secondary | ICD-10-CM | POA: Diagnosis not present

## 2017-02-11 DIAGNOSIS — E11319 Type 2 diabetes mellitus with unspecified diabetic retinopathy without macular edema: Secondary | ICD-10-CM | POA: Diagnosis not present

## 2017-03-11 DIAGNOSIS — H1851 Endothelial corneal dystrophy: Secondary | ICD-10-CM | POA: Diagnosis not present

## 2017-03-11 DIAGNOSIS — H2512 Age-related nuclear cataract, left eye: Secondary | ICD-10-CM | POA: Diagnosis not present

## 2017-03-11 DIAGNOSIS — H25012 Cortical age-related cataract, left eye: Secondary | ICD-10-CM | POA: Diagnosis not present

## 2017-03-11 DIAGNOSIS — E119 Type 2 diabetes mellitus without complications: Secondary | ICD-10-CM | POA: Diagnosis not present

## 2017-03-11 DIAGNOSIS — H35033 Hypertensive retinopathy, bilateral: Secondary | ICD-10-CM | POA: Diagnosis not present

## 2017-03-11 DIAGNOSIS — H25013 Cortical age-related cataract, bilateral: Secondary | ICD-10-CM | POA: Diagnosis not present

## 2017-03-11 DIAGNOSIS — H2513 Age-related nuclear cataract, bilateral: Secondary | ICD-10-CM | POA: Diagnosis not present

## 2017-03-11 DIAGNOSIS — H35373 Puckering of macula, bilateral: Secondary | ICD-10-CM | POA: Diagnosis not present

## 2017-03-24 DIAGNOSIS — M549 Dorsalgia, unspecified: Secondary | ICD-10-CM | POA: Diagnosis not present

## 2017-03-24 DIAGNOSIS — J069 Acute upper respiratory infection, unspecified: Secondary | ICD-10-CM | POA: Diagnosis not present

## 2017-03-24 DIAGNOSIS — J200 Acute bronchitis due to Mycoplasma pneumoniae: Secondary | ICD-10-CM | POA: Diagnosis not present

## 2017-03-28 DIAGNOSIS — Z5181 Encounter for therapeutic drug level monitoring: Secondary | ICD-10-CM | POA: Diagnosis not present

## 2017-03-28 DIAGNOSIS — Z794 Long term (current) use of insulin: Secondary | ICD-10-CM | POA: Diagnosis not present

## 2017-03-28 DIAGNOSIS — E1165 Type 2 diabetes mellitus with hyperglycemia: Secondary | ICD-10-CM | POA: Diagnosis not present

## 2017-03-28 DIAGNOSIS — E1140 Type 2 diabetes mellitus with diabetic neuropathy, unspecified: Secondary | ICD-10-CM | POA: Diagnosis not present

## 2017-04-01 DIAGNOSIS — R238 Other skin changes: Secondary | ICD-10-CM | POA: Diagnosis not present

## 2017-04-06 DIAGNOSIS — H2512 Age-related nuclear cataract, left eye: Secondary | ICD-10-CM | POA: Diagnosis not present

## 2017-04-06 DIAGNOSIS — H25812 Combined forms of age-related cataract, left eye: Secondary | ICD-10-CM | POA: Diagnosis not present

## 2017-04-18 DIAGNOSIS — H2512 Age-related nuclear cataract, left eye: Secondary | ICD-10-CM | POA: Diagnosis not present

## 2017-04-20 DIAGNOSIS — H60331 Swimmer's ear, right ear: Secondary | ICD-10-CM | POA: Diagnosis not present

## 2017-04-20 DIAGNOSIS — K219 Gastro-esophageal reflux disease without esophagitis: Secondary | ICD-10-CM | POA: Diagnosis not present

## 2017-04-20 DIAGNOSIS — I10 Essential (primary) hypertension: Secondary | ICD-10-CM | POA: Diagnosis not present

## 2017-04-20 DIAGNOSIS — E039 Hypothyroidism, unspecified: Secondary | ICD-10-CM | POA: Diagnosis not present

## 2017-04-20 DIAGNOSIS — E119 Type 2 diabetes mellitus without complications: Secondary | ICD-10-CM | POA: Diagnosis not present

## 2017-04-20 DIAGNOSIS — H60501 Unspecified acute noninfective otitis externa, right ear: Secondary | ICD-10-CM | POA: Diagnosis not present

## 2017-04-20 DIAGNOSIS — Z794 Long term (current) use of insulin: Secondary | ICD-10-CM | POA: Diagnosis not present

## 2017-04-20 DIAGNOSIS — Z79899 Other long term (current) drug therapy: Secondary | ICD-10-CM | POA: Diagnosis not present

## 2017-04-20 DIAGNOSIS — F17200 Nicotine dependence, unspecified, uncomplicated: Secondary | ICD-10-CM | POA: Diagnosis not present

## 2017-05-03 DIAGNOSIS — H25011 Cortical age-related cataract, right eye: Secondary | ICD-10-CM | POA: Diagnosis not present

## 2017-05-03 DIAGNOSIS — H2511 Age-related nuclear cataract, right eye: Secondary | ICD-10-CM | POA: Diagnosis not present

## 2017-05-05 DIAGNOSIS — J069 Acute upper respiratory infection, unspecified: Secondary | ICD-10-CM | POA: Diagnosis not present

## 2017-05-05 DIAGNOSIS — H66001 Acute suppurative otitis media without spontaneous rupture of ear drum, right ear: Secondary | ICD-10-CM | POA: Diagnosis not present

## 2017-05-11 DIAGNOSIS — H25811 Combined forms of age-related cataract, right eye: Secondary | ICD-10-CM | POA: Diagnosis not present

## 2017-05-11 DIAGNOSIS — H2511 Age-related nuclear cataract, right eye: Secondary | ICD-10-CM | POA: Diagnosis not present

## 2017-06-13 ENCOUNTER — Other Ambulatory Visit: Payer: Self-pay | Admitting: Internal Medicine

## 2017-06-13 DIAGNOSIS — Z79899 Other long term (current) drug therapy: Secondary | ICD-10-CM | POA: Diagnosis not present

## 2017-06-13 DIAGNOSIS — Z720 Tobacco use: Secondary | ICD-10-CM | POA: Diagnosis not present

## 2017-06-13 DIAGNOSIS — E1165 Type 2 diabetes mellitus with hyperglycemia: Secondary | ICD-10-CM | POA: Diagnosis not present

## 2017-06-13 DIAGNOSIS — Z794 Long term (current) use of insulin: Secondary | ICD-10-CM | POA: Diagnosis not present

## 2017-06-13 DIAGNOSIS — R221 Localized swelling, mass and lump, neck: Secondary | ICD-10-CM

## 2017-06-13 DIAGNOSIS — Z9119 Patient's noncompliance with other medical treatment and regimen: Secondary | ICD-10-CM | POA: Diagnosis not present

## 2017-06-13 DIAGNOSIS — E1140 Type 2 diabetes mellitus with diabetic neuropathy, unspecified: Secondary | ICD-10-CM | POA: Diagnosis not present

## 2017-06-14 ENCOUNTER — Ambulatory Visit
Admission: RE | Admit: 2017-06-14 | Discharge: 2017-06-14 | Disposition: A | Discharge status: Home / Self Care | Payer: Medicare Other | Source: Ambulatory Visit | Attending: Internal Medicine | Admitting: Internal Medicine

## 2017-06-14 DIAGNOSIS — R221 Localized swelling, mass and lump, neck: Secondary | ICD-10-CM

## 2017-06-16 ENCOUNTER — Other Ambulatory Visit: Payer: Self-pay | Admitting: Internal Medicine

## 2017-06-16 DIAGNOSIS — R221 Localized swelling, mass and lump, neck: Secondary | ICD-10-CM

## 2017-06-20 ENCOUNTER — Ambulatory Visit
Admission: RE | Admit: 2017-06-20 | Discharge: 2017-06-20 | Disposition: A | Discharge status: Home / Self Care | Payer: Medicare Other | Source: Ambulatory Visit | Attending: Internal Medicine | Admitting: Internal Medicine

## 2017-06-20 DIAGNOSIS — R221 Localized swelling, mass and lump, neck: Secondary | ICD-10-CM | POA: Diagnosis not present

## 2017-06-20 MED ORDER — IOPAMIDOL (ISOVUE-300) INJECTION 61%
75.0000 mL | Freq: Once | INTRAVENOUS | Status: AC | PRN
  Administered 2017-06-20: 75 mL via INTRAVENOUS

## 2017-06-30 DIAGNOSIS — Z79899 Other long term (current) drug therapy: Secondary | ICD-10-CM | POA: Diagnosis not present

## 2017-06-30 DIAGNOSIS — E1165 Type 2 diabetes mellitus with hyperglycemia: Secondary | ICD-10-CM | POA: Diagnosis not present

## 2017-06-30 DIAGNOSIS — Z794 Long term (current) use of insulin: Secondary | ICD-10-CM | POA: Diagnosis not present

## 2017-06-30 DIAGNOSIS — E1140 Type 2 diabetes mellitus with diabetic neuropathy, unspecified: Secondary | ICD-10-CM | POA: Diagnosis not present

## 2017-06-30 DIAGNOSIS — Z720 Tobacco use: Secondary | ICD-10-CM | POA: Diagnosis not present

## 2017-06-30 DIAGNOSIS — R221 Localized swelling, mass and lump, neck: Secondary | ICD-10-CM | POA: Diagnosis not present

## 2017-07-05 ENCOUNTER — Encounter: Payer: Self-pay | Admitting: Neurology

## 2017-07-05 DIAGNOSIS — Z23 Encounter for immunization: Secondary | ICD-10-CM | POA: Diagnosis not present

## 2017-07-05 DIAGNOSIS — R1011 Right upper quadrant pain: Secondary | ICD-10-CM | POA: Diagnosis not present

## 2017-07-05 DIAGNOSIS — I10 Essential (primary) hypertension: Secondary | ICD-10-CM | POA: Diagnosis not present

## 2017-07-05 DIAGNOSIS — D179 Benign lipomatous neoplasm, unspecified: Secondary | ICD-10-CM | POA: Diagnosis not present

## 2017-07-05 DIAGNOSIS — R42 Dizziness and giddiness: Secondary | ICD-10-CM | POA: Diagnosis not present

## 2017-08-03 DIAGNOSIS — Z794 Long term (current) use of insulin: Secondary | ICD-10-CM | POA: Diagnosis not present

## 2017-08-03 DIAGNOSIS — Z5181 Encounter for therapeutic drug level monitoring: Secondary | ICD-10-CM | POA: Diagnosis not present

## 2017-08-03 DIAGNOSIS — Z720 Tobacco use: Secondary | ICD-10-CM | POA: Diagnosis not present

## 2017-08-03 DIAGNOSIS — E1140 Type 2 diabetes mellitus with diabetic neuropathy, unspecified: Secondary | ICD-10-CM | POA: Diagnosis not present

## 2017-08-03 DIAGNOSIS — E1165 Type 2 diabetes mellitus with hyperglycemia: Secondary | ICD-10-CM | POA: Diagnosis not present

## 2017-08-03 DIAGNOSIS — R413 Other amnesia: Secondary | ICD-10-CM | POA: Diagnosis not present

## 2017-08-22 DIAGNOSIS — E1165 Type 2 diabetes mellitus with hyperglycemia: Secondary | ICD-10-CM | POA: Diagnosis not present

## 2017-08-22 DIAGNOSIS — Z01411 Encounter for gynecological examination (general) (routine) with abnormal findings: Secondary | ICD-10-CM | POA: Diagnosis not present

## 2017-08-22 DIAGNOSIS — Z794 Long term (current) use of insulin: Secondary | ICD-10-CM | POA: Diagnosis not present

## 2017-08-22 DIAGNOSIS — N814 Uterovaginal prolapse, unspecified: Secondary | ICD-10-CM | POA: Diagnosis not present

## 2017-09-01 NOTE — Progress Notes (Signed)
[facility] Clinic Note - Initial Visit   Date: 09/02/17  Kelsey Conley MRN: [id number] DOB: 05-26-1956   Dear Dr. Alyson Ingles:  Thank you for your kind referral of Kelsey Conley for consultation of imbalance. Although her history is well known to you, please allow [place] to reiterate it for the purpose of our medical record. The patient was accompanied to the clinic by self.    History of Present Illness: Kelsey Conley is a 61 y.o. 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    She was diagnosed with diabetes in 2006.  Several years prior to this, she was having burning stabbing pain involving the feet and was diagnosed with neuropathy.  She also complains of burning pain of the hands.  She does not recall whether NCS/EMG has been performed.  She complains of imbalance and "dizziness" for many years.  She has been evaluated at [facility] for the same complaints in 2014.  She denies room spinning, nausea/vomiting, but complains more of being off balance and "woosy".  This can occur at rest or with movement.  Movement does not make it worse and rest does not alleviate symptoms.  It can occur suddenly, without warning, and be constant.  She denies any heart palpitations. She walks unassisted.  Out-side paper records, electronic medical record, and images have been reviewed where available and summarized as:  MRI brain 07/23/2013:   Normal MRI scan of the brain with and without contrast  Labs 2018:  HbA1c 9.9*, TSH 2.02 Labs 2017: vitamin B12 294  EKG QTc 461*  Past Medical History:  Diagnosis Date  . Cancer [facility])    lymph node right neck  . Diabetes mellitus without complication ([place])   . Hypertension   . Migraines   . Neuromuscular disorder ([place])    neuropathy    Past Surgical History:    Procedure Laterality Date  . CHOLECYSTECTOMY    . lower back surgery     L4, L5, L3  . NECK SURGERY    . tubaligation       Medications:  Outpatient Encounter Medications as of 09/02/2017  Medication Sig Note  . DULoxetine (CYMBALTA) 30 MG capsule Take 30 mg by mouth 2 (two) times daily.   Marland Kitchen esomeprazole (NEXIUM) 40 MG capsule Take 40 mg by mouth 2 (two) times daily.   Marland Kitchen gabapentin (NEURONTIN) 600 MG tablet Take 1,200 mg by mouth 2 (two) times daily.    Marland Kitchen glimepiride (AMARYL) 4 MG tablet Take 4 mg by mouth 2 (two) times daily.   . insulin detemir (LEVEMIR) 100 UNIT/ML injection Inject 90 Units into the skin daily.    Marland Kitchen levothyroxine (SYNTHROID, LEVOTHROID) 175 MCG tablet Take 175 mcg by mouth daily before breakfast.   . LINZESS 145 MCG CAPS capsule Take 145 mg by mouth daily. 12/30/2015: Received from: External Pharmacy Received Sig:   . metFORMIN (GLUCOPHAGE) 1000 MG tablet Take 1,000 mg by mouth 2 (two) times daily with a meal.   . potassium chloride 20 MEQ TBCR Take 20 mEq by mouth daily.   . pravastatin (PRAVACHOL) 40 MG tablet Take 40 mg by mouth at bedtime.   . sitaGLIPtin (JANUVIA) 100 MG tablet Take 100 mg by mouth daily.   . valsartan-hydrochlorothiazide (DIOVAN-HCT) 160-12.5 MG per tablet Take 1 tablet by mouth daily.    No facility-administered encounter medications on file as of 09/02/2017.  Allergies:  Allergies  Allergen Reactions  . Aspirin Nausea And Vomiting    Headaches and bad stomach cramps   . Codeine Nausea And Vomiting    Headache and bad stomach cramps    Family History: Family History  Problem Relation Age of Onset  . Migraines Mother   . Crohn's disease Mother   . Hypertension Mother   . Stroke Father   . Migraines Sister   . Diabetes Sister   . Hypertension Sister   . Thyroid disease Sister   . Cancer Maternal Aunt   . Cancer Paternal Uncle        brain, lung, liver, stomach  . Cancer Maternal Grandmother   . Diabetes Maternal  Grandmother   . Cancer Maternal Grandfather        prostate  . Cancer Maternal Uncle   . Heart attack Paternal Grandmother        Occurred in her 73's.     Social History: Social History   Tobacco Use  . Smoking status: Current Every Day Smoker    Packs/day: 1.00    Years: 35.00    Pack years: 35.00    Types: Cigarettes  . Smokeless tobacco: Never Used  Substance Use Topics  . Alcohol use: No    Alcohol/week: 0.0 oz  . Drug use: No   Social History   Social History Narrative   Lives with husband in a one story home.  Has 5 children.     On disability since 2016.     Education: college.     Review of Systems:  CONSTITUTIONAL: No fevers, chills, night sweats, or weight loss.   EYES: No visual changes or eye pain ENT: No hearing changes.  No history of nose bleeds.   RESPIRATORY: No cough, wheezing and shortness of breath.   CARDIOVASCULAR: Negative for chest pain, and palpitations.   GI: Negative for abdominal discomfort, blood in stools or black stools.  No recent change in bowel habits.   GU:  No history of incontinence.   MUSCLOSKELETAL: +history of joint pain or swelling.  No myalgias.   SKIN: Negative for lesions, rash, and itching.   HEMATOLOGY/ONCOLOGY: Negative for prolonged bleeding, bruising easily, and swollen nodes.  No history of cancer.   ENDOCRINE: Negative for cold or heat intolerance, polydipsia or goiter.   PSYCH:  +depression or anxiety symptoms.   NEURO: As Above.   Vital Signs:  BP 120/84   Pulse 72   Ht 5\' 8"  (1.727 m)   Wt 212 lb 8 oz (96.4 kg)   SpO2 94%   BMI 32.31 kg/m    General Medical Exam:   General:  Well appearing, comfortable, strong tobacco odor.   Eyes/ENT: see cranial nerve examination.   Neck: No masses appreciated.  Full range of motion without tenderness.  No carotid bruits. Respiratory:  Clear to auscultation, good air entry bilaterally.   Cardiac:  Regular rate and rhythm, no murmur.   Extremities:  No deformities,  edema, or skin discoloration.  Skin:  No rashes or lesions.  Neurological Exam: MENTAL STATUS including orientation to time, place, person, recent and remote memory, attention span and concentration, language, and fund of knowledge is normal.  Speech is not dysarthric.  CRANIAL NERVES: II:  No visual field defects.  Unremarkable fundi.   III-IV-VI: Pupils equal round and reactive to light.  Normal conjugate, extra-ocular eye movements in all directions of gaze.  No nystagmus.  No ptosis.   V:  Normal facial  sensation.   VII:  Normal facial symmetry and movements.   VIII:  Normal hearing and vestibular function.   IX-X:  Normal palatal movement.   XI:  Normal shoulder shrug and head rotation.   XII:  Normal tongue strength and range of motion, no deviation or fasciculation.  MOTOR:   No atrophy, fasciculations or abnormal movements.  No pronator drift.  Tone is normal.    Right Upper Extremity:    Left Upper Extremity:    Deltoid  5/5   Deltoid  5/5   Biceps  5/5   Biceps  5/5   Triceps  5/5   Triceps  5/5   Wrist extensors  5/5   Wrist extensors  5/5   Wrist flexors  5/5   Wrist flexors  5/5   Finger extensors  5/5   Finger extensors  5/5   Finger flexors  5/5   Finger flexors  5/5   Dorsal interossei  5/5   Dorsal interossei  5/5   Abductor pollicis  5/5   Abductor pollicis  5/5   Tone (Ashworth scale)  0  Tone (Ashworth scale)  0   Right Lower Extremity:    Left Lower Extremity:    Hip flexors  5/5   Hip flexors  5/5   Hip extensors  5/5   Hip extensors  5/5   Knee flexors  5/5   Knee flexors  5/5   Knee extensors  5/5   Knee extensors  5/5   Dorsiflexors  5/5   Dorsiflexors  5/5   Plantarflexors  5/5   Plantarflexors  5/5   Toe extensors  5/5   Toe extensors  5/5   Toe flexors  5/5   Toe flexors  5/5   Tone (Ashworth scale)  0  Tone (Ashworth scale)  0   MSRs:  Right                                                                 Left brachioradialis 2+  brachioradialis  2+  biceps 2+  biceps 2+  triceps 2+  triceps 2+  patellar 2+  patellar 2+  ankle jerk 0  ankle jerk 0  Hoffman no  Hoffman no  plantar response down  plantar response down   SENSORY:  Reduced temperature, pin prick, and vibration distal to knees bilaterally.   Romberg's sign present.   COORDINATION/GAIT: Normal finger-to- nose-finger.  Intact rapid alternating movements bilaterally. Gait is wide-based, antalgic, unassisted.      IMPRESSION: 1.  Lightheadedness/imbalance, contributed by sensory ataxia and proprioceptive loss from her neuropathy, but this would not be present when at rest.  MRI brain from 2014 is normal.  No nystagmus, nausea/vomiting is present.  I will order NCS/EMG of the right side to assess severity of neuropathy.  She may also need cardiac monitor to evaluate for cardiac arrhythmias.  Check B12, MMA, folate, copper.  Start physical therapy for gait training.  Fall precautions was discussed.  She may benefit from using a cane, especially on uneven surfaces.  2.  Diabetic neuropathy causing neuropathic pain of the legs and ataxia. I had extensive discussion with the patient regarding the pathogenesis, etiology, management, and natural course of neuropathy. Aggressive management of diabetes was stressed as  she has it is poorly controlled.  Life style modification was stressed.    3.  Tobacco use with history of uterine cancer  - Currently smoking 1 packs/day   - Patient was informed of the dangers of tobacco abuse including stroke, cancer, and MI, as well as benefits of tobacco cessation. - Patient is not willing to quit at this time. - Approximately 5 mins were spent counseling patient cessation techniques. We discussed various methods to help quit smoking, including deciding on a date to quit, joining a support group, pharmacological agents- nicotine gum/patch/lozenges, chantix,  - I will reassess her progress at   Return to clinic in 4 months.  Thank you for allowing  me to participate in patient's care.  If I can answer any additional questions, I would be pleased to do so.    Sincerely,    Dariona Postma K. Posey Pronto, DO

## 2017-09-02 ENCOUNTER — Ambulatory Visit (INDEPENDENT_AMBULATORY_CARE_PROVIDER_SITE_OTHER): Payer: Medicare Other | Admitting: Neurology

## 2017-09-02 ENCOUNTER — Encounter: Payer: Self-pay | Admitting: Neurology

## 2017-09-02 ENCOUNTER — Other Ambulatory Visit (HOSPITAL_COMMUNITY)
Admission: RE | Admit: 2017-09-02 | Discharge: 2017-09-02 | Disposition: A | Discharge status: Home / Self Care | Payer: Medicare Other | Source: Ambulatory Visit | Attending: Neurology | Admitting: Neurology

## 2017-09-02 VITALS — BP 120/84 | HR 72 | Ht 68.0 in | Wt 212.5 lb

## 2017-09-02 DIAGNOSIS — E1142 Type 2 diabetes mellitus with diabetic polyneuropathy: Secondary | ICD-10-CM

## 2017-09-02 DIAGNOSIS — R42 Dizziness and giddiness: Secondary | ICD-10-CM

## 2017-09-02 DIAGNOSIS — F17210 Nicotine dependence, cigarettes, uncomplicated: Secondary | ICD-10-CM

## 2017-09-02 LAB — VITAMIN B12: Vitamin B-12: 305 pg/mL (ref 180–914)

## 2017-09-02 LAB — FOLATE: Folate: 11.8 ng/mL (ref >5.9)

## 2017-09-02 NOTE — Patient Instructions (Addendum)
1.  Check labs 2.  NCS/EMG of the right arm and leg 3.  Try to stop smoking 4.  Start physical therapy  [facility]  Preventing Falls in the Home   Falls are common, often dreaded events in the lives of older people. Aside from the obvious injuries and even death that may result, falls can cause wide-ranging consequences including loss of independence, mental decline, decreased activity, and mobility. Younger people are also at risk of falling, especially those with chronic illnesses and fatigue.  Ways to reduce the risk for falling:  * Examine diet and medications. Warm foods and alcohol dilate blood vessels, which can lead to dizziness when standing. Sleep aids, antidepressants, and pain medications can also increase the likelihood of a fall.  * Get a vison exam. Poor vision, cataracts, and glaucoma increase the chances of falling.  * Check foot gear. Shoes should fit snugly and have a sturdy, nonskid sole and broad, low heel.  * Participate in a physician-approved exercise program to build and maintain muscle strength and improve balance and coordination.  * Increase vitamin D intake. Vitamin D improves muscle strength and increases the amount of calcium the body is able to absorb and deposit in bones.  How to prevent falls from common hazards:  * Floors - Remove all loose wires, cords, and throw rugs. Minimize clutter. Make sure rugs are anchored and smooth. Keep furniture in its usual place.  * Chairs - Use chairs with straight backs, armrests, and firm seats. Add firm cushions to existing pieces to add height.  * Bathroom - Install grab bars and non-skid tape in the tub or shower. Use a bathtub transfer bench or a shower chair with a back support. Use an elevated toilet seat and/or safety rails to assist standing from a low surface. Do not use towel racks or bathroom tissue holders to help you stand.  * Lighting - Make sure halls, stairways, and entrances are well-lit. Install a night  light in your bathroom or hallway. Make sure there is a light switch at the top and bottom of the staircase. Turn lights on if you get up in the middle of the night. Make sure lamps or light switches are within reach of the bed if you have to get up during the night.  * Kitchen - Install non-skid rubber mats near the sink and stove. Clean spills immediately. Store frequently used utensils, pots, and pans between waist and eye level. This helps prevent reaching and bending. Sit when getting things out of the lower cupboards.  * Living room / [place] furniture with wide spaces in between, giving enough room to move around. Establish a route through the living room that gives you something to hold onto as you walk.  * Stairs - Make sure treads, rails, and rugs are secure. Install a rail on both sides of the stairs. If stairs are a threat, it might be helpful to arrange most of your activities on the lower level to reduce the number of times you must climb the stairs.  * Entrances and doorways - Install metal handles on the walls adjacent to the doorknobs of all doors to make it more secure as you travel through the doorway.  Tips for maintaining balance:  * Keep at least one hand free at all times Try using a backpack or fanny pack to hold things rather than carrying them in your hands. Never carry objects in both hands when walking as this interferes  with keeping your balance.  * Attempt to swing both arms from front to back while walking. This might require a conscious effort if Parkinson's disease has diminished your movement. It will, however, help you to maintain balance and posture, and reduce fatigue.  * Consciously lift your feet off the ground when walking. Shuffling and dragging of the feet is a common culprit in losing your balance.  * When trying to navigate turns, use a "U" technique of facing forward and making a wide turn, rather than pivoting sharply.  * Try to stand with your feet  shoulder-length apart. When your feet are close together for any length of time, you increase your risk of losing your balance and falling.  * Do one thing at a time. Do not try to walk and accomplish another task, such as reading or looking around. The decrease in your automatic reflexes complicates motor function, so the less distraction, the better.  * Do not wear rubber or gripping soled shoes, they might "catch" on the floor and cause tripping.  * Move slowly when changing positions. Use deliberate, concentrated movements and, if needed, use a grab bar or walking aid. Count fifteen (15) seconds after standing to begin walking.  * If balance is a continuous problem, you might want to consider a walking aid such as a cane, walking stick, or walker. Once you have mastered walking with help, you may be ready to try it again on your own.  This information is provided by [facility] and is not intended to replace the medical advice of your physician or other health care providers. Please consult your physician or other health care providers for advice regarding your specific medical condition.

## 2017-09-03 LAB — COPPER, SERUM: Copper: 175 ug/dL — ABNORMAL HIGH (ref 72–166)

## 2017-09-05 ENCOUNTER — Ambulatory Visit (HOSPITAL_COMMUNITY): Payer: Medicare Other | Attending: Family Medicine | Admitting: Physical Therapy

## 2017-09-05 DIAGNOSIS — M6281 Muscle weakness (generalized): Secondary | ICD-10-CM | POA: Insufficient documentation

## 2017-09-05 DIAGNOSIS — R296 Repeated falls: Secondary | ICD-10-CM | POA: Insufficient documentation

## 2017-09-06 ENCOUNTER — Ambulatory Visit (INDEPENDENT_AMBULATORY_CARE_PROVIDER_SITE_OTHER): Payer: Medicare Other | Admitting: Neurology

## 2017-09-06 DIAGNOSIS — G5601 Carpal tunnel syndrome, right upper limb: Secondary | ICD-10-CM

## 2017-09-06 DIAGNOSIS — E1142 Type 2 diabetes mellitus with diabetic polyneuropathy: Secondary | ICD-10-CM

## 2017-09-06 NOTE — Procedures (Signed)
[facility]  [address], [address]  [place], [place] [address] Tel: [phone] Fax:  [phone] Test Date:  09/06/2017  Patient: Kelsey Conley DOB: August 12, 1956 Physician: Narda Amber, DO  Sex: Female Height: 5\' 8"  Ref Phys: Narda Amber, DO  ID#: [id number] Temp: 37.0C Technician:    Patient Complaints: This is a 61 year-old female with diabetic neuropathy referred for evaluation of worsening paresthesias, pain, and imbalance.  NCV & EMG Findings: Extensive electrodiagnostic testing of the right upper and lower extremity shows:  1. Right median and ulnar sensory responses are within normal limits. Right mixed palmar sensory responses are abnormal. 2. Right sural and superficial peroneal sensory responses are absent. 3. Right median and ulnar motor responses are within normal limits. 4. Right tibial and peroneal (EDB) motor responses are absent. The right peroneal motor response at the tibialis anterior is within normal limits. 5. Right tibial H reflex study is absent. 6. In the right upper extremity, there is no evidence of active or chronic motor axon loss changes affecting any of the tested muscles.  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 Overall, these findings are severe in degree electrically.   Right median neuropathy at or distal to the wrist, consistent with the clinical diagnosis of carpal tunnel syndrome. Overall, these findings are very mild in degree electrically.    ___________________________ Narda Amber, DO    Nerve Conduction Studies Anti Sensory Summary Table   Stim Site NR Peak (ms) Norm Peak (ms) P-T Amp (V) Norm P-T Amp  Right Median Anti Sensory (2nd Digit)  37C  Wrist    3.2 <3.8 20.0 >10  Right Sup Peroneal Anti Sensory (Ant Lat Mall)  37C  12 cm NR  <4.6  >3  Right Sural Anti Sensory (Lat Mall)  37C  Calf  NR  <4.6  >3  Right Ulnar Anti Sensory (5th Digit)  37C  Wrist    2.7 <3.2 17.4 >5   Motor Summary Table   Stim Site NR Onset (ms) Norm Onset (ms) O-P Amp (mV) Norm O-P Amp Site1 Site2 Delta-0 (ms) Dist (cm) Vel (m/s) Norm Vel (m/s)  Right Median Motor (Abd Poll Brev)  37C  Wrist    3.1 <4.0 8.9 >5 Elbow Wrist 6.2 32.0 52 >50  Elbow    9.3  8.4         Right Peroneal Motor (Ext Dig Brev)  37C  Ankle NR  <6.0  >2.5 B Fib Ankle  0.0  >40  B Fib NR     Poplt B Fib  0.0  >40  Poplt NR            Right Peroneal TA Motor (Tib Ant)  37C  Fib Head    3.7 <4.5 6.3 >3 Poplit Fib Head 1.6 8.0 50 >40  Poplit    5.3  6.1         Right Tibial Motor (Abd Hall Brev)  37C  Ankle NR  <6.0  >4 Knee Ankle  0.0  >40  Knee NR            Right Ulnar Motor (Abd Dig Minimi)  37C  Wrist    2.4 <3.1 8.2 >7 B Elbow Wrist 4.6 24.0 52 >50  B Elbow    7.0  8.1  A Elbow B Elbow 1.9 10.0 53 >50  A Elbow    8.9  8.0  Comparison Summary Table   Stim Site NR Peak (ms) Norm Peak (ms) P-T Amp (V) Site1 Site2 Delta-P (ms) Norm Delta (ms)  Right Median/Ulnar Palm Comparison (Wrist - 8cm)  37C  Median Palm    2.1 <2.2 30.0 Median Palm Ulnar Palm 0.7   Ulnar Palm    1.4 <2.2 8.9       H Reflex Studies   NR H-Lat (ms) Lat Norm (ms) L-R H-Lat (ms)  Right Tibial (Gastroc)  37C  NR  <35    EMG   Side Muscle Ins Act Fibs Psw Fasc Number Recrt Dur Dur. Amp Amp. Poly Poly. Comment  Right AntTibialis Nml Nml Nml Nml 1- Rapid Few 1+ Few 1+ Nml Nml N/A  Right 1stDorInt Nml Nml Nml Nml Nml Nml Nml Nml Nml Nml Nml Nml N/A  Right Ext Indicis Nml Nml Nml Nml Nml Nml Nml Nml Nml Nml Nml Nml N/A  Right PronatorTeres Nml Nml Nml Nml Nml Nml Nml Nml Nml Nml Nml Nml N/A  Right Biceps Nml Nml Nml Nml Nml Nml Nml Nml Nml Nml Nml Nml N/A  Right Triceps Nml Nml Nml Nml Nml Nml Nml Nml Nml Nml Nml Nml N/A  Right Deltoid Nml Nml Nml Nml Nml Nml Nml Nml Nml Nml Nml Nml N/A  Right Gastroc Nml Nml Nml Nml 1- Rapid Some  1+ Some 1+ Nml Nml N/A  Right Flex Dig Long Nml Nml Nml Nml 2- Rapid Some 1+ Some 1+ Nml Nml N/A  Right RectFemoris Nml Nml Nml Nml Nml Nml Nml Nml Nml Nml Nml Nml N/A  Right GluteusMed Nml Nml Nml Nml Nml Nml Nml Nml Nml Nml Nml Nml N/A  Right BicepsFemS Nml Nml Nml Nml Nml Nml Nml Nml Nml Nml Nml Nml N/A      Waveforms:

## 2017-09-08 ENCOUNTER — Telehealth: Payer: Self-pay | Admitting: *Deleted

## 2017-09-08 NOTE — Telephone Encounter (Signed)
Attempted to contact patient x 2.  Phone busy.

## 2017-09-08 NOTE — Telephone Encounter (Signed)
-----   Message from Alda Berthold, DO sent at 09/07/2017  5:37 PM EST ----- Nerve testing shows neuropathy affecting the leg and very mild carpal tunnel in the right hand.  Recommend using a wrist splint for CTS.   No evidence of neuropathy in the hands, which is good.  Thanks.

## 2017-09-09 ENCOUNTER — Telehealth: Payer: Self-pay | Admitting: *Deleted

## 2017-09-09 NOTE — Telephone Encounter (Signed)
-----   Message from Alda Berthold, DO sent at 09/07/2017  5:37 PM EST ----- Nerve testing shows neuropathy affecting the leg and very mild carpal tunnel in the right hand.  Recommend using a wrist splint for CTS.   No evidence of neuropathy in the hands, which is good.  Thanks.

## 2017-09-09 NOTE — Telephone Encounter (Signed)
Called patient and she was asleep.  Left message with her husband giving the results and instructions.

## 2017-09-14 DIAGNOSIS — E1140 Type 2 diabetes mellitus with diabetic neuropathy, unspecified: Secondary | ICD-10-CM | POA: Diagnosis not present

## 2017-09-14 DIAGNOSIS — Z720 Tobacco use: Secondary | ICD-10-CM | POA: Diagnosis not present

## 2017-09-14 DIAGNOSIS — E039 Hypothyroidism, unspecified: Secondary | ICD-10-CM | POA: Diagnosis not present

## 2017-09-14 DIAGNOSIS — E1165 Type 2 diabetes mellitus with hyperglycemia: Secondary | ICD-10-CM | POA: Diagnosis not present

## 2017-09-14 DIAGNOSIS — Z5181 Encounter for therapeutic drug level monitoring: Secondary | ICD-10-CM | POA: Diagnosis not present

## 2017-09-14 DIAGNOSIS — Z794 Long term (current) use of insulin: Secondary | ICD-10-CM | POA: Diagnosis not present

## 2017-09-22 ENCOUNTER — Telehealth (HOSPITAL_COMMUNITY): Payer: Self-pay | Admitting: Family Medicine

## 2017-09-22 ENCOUNTER — Ambulatory Visit (HOSPITAL_COMMUNITY): Payer: Medicare Other | Admitting: Physical Therapy

## 2017-09-22 ENCOUNTER — Encounter (HOSPITAL_COMMUNITY): Payer: Self-pay | Admitting: Physical Therapy

## 2017-09-22 DIAGNOSIS — M6281 Muscle weakness (generalized): Secondary | ICD-10-CM | POA: Diagnosis not present

## 2017-09-22 DIAGNOSIS — R296 Repeated falls: Secondary | ICD-10-CM

## 2017-09-22 NOTE — Patient Instructions (Addendum)
Toe Raise (Sitting)    Raise toes, keeping heels on floor.  Now raise up onto your toes raising your heels.  Repeat ___10_ times per set. Do _1___ sets per session. Do __2__ sessions per day.  http://orth.exer.us/46  Strengthening: Hip Abduction (Side-Lying)    Tighten muscles on front of left thigh, then lift leg ___12_ inches from surface, keeping knee locked.  Repeat __10__ times per set. Do __1__ sets per session. Do ___2_ sessions per day.  http://orth.exer.us/622   Copyright  VHI. All rights reserved.  Bridging    Slowly raise buttocks from floor, keeping stomach tight. Repeat __15__ times per set. Do ____1 sets per session. Do _2___ sessions per day.  http://orth.exer.us/1096   Copyright  VHI. All rights reserved.

## 2017-09-22 NOTE — Telephone Encounter (Signed)
09/22/17  9:05am  Left a message asking if patient could come in at 11:15 per cindy

## 2017-09-22 NOTE — Therapy (Signed)
[place], [place], [address] Phone: [phone]   Fax:  [phone]  Physical Therapy Evaluation  Patient Details  Name: Kelsey Conley MRN: [id number] Date of Birth: 01/14/1956 Referring Provider: Narda Amber    Encounter Date: 09/22/2017  PT End of Session - 09/22/17 1156    Visit Number  1    Number of Visits  8    Date for PT Re-Evaluation  10/22/17    Authorization Type  medicare    Authorization - Visit Number  1    Authorization - Number of Visits  30    PT Start Time  1751    PT Stop Time  1150    PT Time Calculation (min)  45 min    Activity Tolerance  Patient tolerated treatment well    Behavior During Therapy  [facility] for tasks assessed/performed       Past Medical History:  Diagnosis Date  . Cancer [facility])    lymph node right neck  . Diabetes mellitus without complication ([place])   . Hypertension   . Migraines   . Neuromuscular disorder ([place])    neuropathy    Past Surgical History:  Procedure Laterality Date  . CHOLECYSTECTOMY    . lower back surgery     L4, L5, L3  . NECK SURGERY    . tubaligation      There were no vitals filed for this visit.   Subjective Assessment - 09/22/17 1101    Subjective  Ms. Brierley states that she has had neuropathy for years.  She states that her main concern is that she wants her muscles built up.  She has been trying to use her legs more but she is still kind of weak.      Pertinent History  DM, B carpal tunnel, cervical and lumbar surgery, COPD    Limitations  Standing;Walking;Lifting    How long can you stand comfortably?  Able to stand for 5-10 minutes     How long can you walk comfortably?  Pt is able to walk without an assistive device.  Last summer she was able to walk a mile now she can only walk for 10 to 15 minutes.      Patient Stated Goals  Walk better,     Currently in Pain?  Yes    Pain Score  8     Pain Location  Foot    Pain Orientation   Right;Left    Pain Descriptors / Indicators  Burning    Pain Type  Chronic pain    Pain Onset  More than a month ago    Pain Frequency  Constant    Aggravating Factors   not sure    Pain Relieving Factors  nothing     Effect of Pain on Daily Activities  increased     Multiple Pain Sites  Yes    Pain Location  Back    Pain Orientation  Lower    Pain Descriptors / Indicators  Aching    Pain Type  Chronic pain    Pain Onset  More than a month ago    Pain Frequency  Intermittent    Aggravating Factors   weather changing     Pain Relieving Factors  warmer weather     Effect of Pain on Daily Activities  increases          [facility] PT Assessment - 09/22/17 0001  Assessment   Medical Diagnosis  polyneuropathy    Referring Provider  Narda Amber     Onset Date/Surgical Date  08/25/17    Next MD Visit  unscheduled     Prior Therapy  for her back       Precautions   Precautions  Fall      Restrictions   Weight Bearing Restrictions  No      Balance Screen   Has the patient fallen in the past 6 months  Yes    How many times?  10 or  more    Has the patient had a decrease in activity level because of a fear of falling?   Yes    Is the patient reluctant to leave their home because of a fear of falling?   Yes      [place] residence      Prior Function   Level of Independence  Independent      Cognition   Overall Cognitive Status  Within Functional Limits for tasks assessed      Observation/Other Assessments   Focus on Therapeutic Outcomes (FOTO)   36      Functional Tests   Functional tests  Single leg stance;Sit to Stand      Single Leg Stance   Comments  Rt LE 2 seconds;  Lt 1 second       Sit to Stand   Comments  With UE assist:  5x 25.88      ROM / Strength   AROM / PROM / Strength  Strength      Strength   Strength Assessment Site  Hip;Knee;Ankle    Right/Left Hip  Right;Left    Right Hip Flexion  5/5    Right Hip  Extension  3+/5    Right Hip ABduction  5/5    Left Hip Flexion  5/5    Left Hip Extension  3+/5    Left Hip ABduction  3+/5    Right/Left Knee  Right;Left    Right Knee Extension  5/5    Left Knee Extension  5/5    Right/Left Ankle  Right;Left    Right Ankle Dorsiflexion  3+/5    Left Ankle Dorsiflexion  3/5      Ambulation/Gait   Ambulation Distance (Feet)  254 Feet    Assistive device  None    Gait Comments  3 minute walking test              Objective measurements completed on examination: See above findings.      [facility] Adult PT Treatment/Exercise - 09/22/17 0001      Exercises   Exercises  Knee/Hip      Knee/Hip Exercises: Standing   SLS  x 3      Knee/Hip Exercises: Seated   Other Seated Knee/Hip Exercises  heel/ankle raise x 10    Sit to Sand  5 reps      Knee/Hip Exercises: Supine   Bridges  10 reps      Knee/Hip Exercises: Sidelying   Hip ABduction  Left;10 reps             PT Education - 09/22/17 1155    Education provided  Yes    Education Details  HEP; proper way to transition from sit to supine     Person(s) Educated  Patient    Methods  Explanation;Handout    Comprehension  Verbalized understanding;Returned demonstration  PT Short Term Goals - 09/22/17 1204      PT SHORT TERM GOAL #1   Title  Pt to be able to single leg stance for 10 seconds bilaterally to decrease fall risk     Time  2    Period  Weeks    Status  New    Target Date  10/06/17      PT SHORT TERM GOAL #2   Title  PT to be able to walk 15 minutes to be able to complete short shopping trips     Time  2    Period  Weeks    Status  New      PT SHORT TERM GOAL #3   Title  Pt strength of B LE to be improved 1/2 grade to allow pt to come sit to stand with increased ease.     Time  2    Period  Weeks    Status  New        PT Long Term Goals - 09/22/17 1207      PT LONG TERM GOAL #1   Title  PT to be able to single leg stance for 15 seconds on both LE  to decrease risk of falling     Time  4    Period  Weeks    Status  New    Target Date  10/20/17      PT LONG TERM GOAL #2   Title  Pt to be I in home balance program in order to continue improving in her balance at discharge.    Time  4    Period  Weeks    Status  New      PT LONG TERM GOAL #3   Title  PT LE strength to be increased one grade to allow pt to come sit to stand without UE support.     Time  4    Period  Weeks    Status  New      PT LONG TERM GOAL #4   Title  PT to be able to walk for 30 mintues at a time to allow pt to go on shopping trips.     Time  4    Period  Weeks             Plan - 09/22/17 1157    Clinical Impression Statement  Ms. Ferraz is a 61 yo female who has multiple complaints due to her neuropathy.  The therapist and patient conversed about the fact that therapy can not cure neuropathy nor will it do anything to decrease the burning sensation that she is experiencing , however, we can improve her balance and strength in an attempt to decrease her frequency of falling.  Evaluation demonstrates decreased balance, decreased activity tolerance and decreased strength.  Ms. Freshour will benefit from out patient physical therapy to address these issues and maximize her functional ability.      History and Personal Factors relevant to plan of care:  DM, 2 previous back surgeries, HTN, cervical surgery     Clinical Presentation  Stable    Clinical Decision Making  Low    Rehab Potential  Good    PT Frequency  2x / week    PT Duration  4 weeks    PT Treatment/Interventions  Therapeutic activities;Therapeutic exercise;Balance training;Neuromuscular re-education;Patient/family education    PT Next Visit Plan  focus on balance begin rocker board, tandem stance, heel raises, tandem, retro gait,  side stepping with t-band and progress as indicated        Patient will benefit from skilled therapeutic intervention in order to improve the following deficits and  impairments:  Abnormal gait, Decreased activity tolerance, Decreased balance, Difficulty walking, Decreased strength  Visit Diagnosis: Repeated falls - Plan: PT plan of care cert/re-cert  Muscle weakness (generalized) - Plan: PT plan of care cert/re-cert  G-Codes - 88/89/16 1211/01/27    Functional Assessment Tool Used (Outpatient Only)  foto    Functional Limitation  Mobility: Walking and moving around    Mobility: Walking and Moving Around Current Status [phone])  At least 60 percent but less than 80 percent impaired, limited or restricted    Mobility: Walking and Moving Around Goal Status [phone])  At least 40 percent but less than 60 percent impaired, limited or restricted        Problem List Patient Active Problem List   Diagnosis Date Noted  . Tobacco abuse 12/31/2015  . Chest pain 12/30/2015  . Diabetes mellitus with complication ([place]) 00/34/9179  . Essential hypertension 12/30/2015  . IBS (irritable bowel syndrome) 12/30/2015  . GERD (gastroesophageal reflux disease) 12/30/2015  . Hypothyroid 12/30/2015  . Depression 12/30/2015  . Peripheral neuropathy 12/30/2015  . Chest pain of uncertain etiology   . Hypokalemia   . Unspecified hereditary and idiopathic peripheral neuropathy 07/19/2013  . Type II or unspecified type diabetes mellitus without mention of complication, not stated as uncontrolled 07/19/2013  . Abnormality of gait 07/19/2013  . Frequent falls 07/19/2013  . Uterovaginal prolapse, complete 02/06/2013    Rayetta Humphrey, PT CLT [phone] 09/22/2017, 12:15 PM  [place] [address] [place], [place], [address] Phone: [phone]   Fax:  [phone]  Name: Kelsey Conley MRN: [id number] Date of Birth: 07/05/1956

## 2017-09-22 NOTE — Telephone Encounter (Signed)
09/22/17  Husband called back to say that his wife would be here at 11:15 today

## 2017-09-23 ENCOUNTER — Ambulatory Visit (HOSPITAL_COMMUNITY): Payer: Medicare Other | Admitting: Physical Therapy

## 2017-09-23 DIAGNOSIS — M6281 Muscle weakness (generalized): Secondary | ICD-10-CM | POA: Diagnosis not present

## 2017-09-23 DIAGNOSIS — R296 Repeated falls: Secondary | ICD-10-CM

## 2017-09-23 NOTE — Therapy (Signed)
[place], [place], [address] Phone: [phone]   Fax:  [phone]  Physical Therapy Treatment  Patient Details  Name: Kelsey Conley MRN: [id number] Date of Birth: 08/31/1956 Referring Provider: Narda Amber    Encounter Date: 09/23/2017  PT End of Session - 09/23/17 0936    Visit Number  2    Number of Visits  8    Date for PT Re-Evaluation  10/22/17    Authorization Type  medicare    Authorization - Visit Number  2    Authorization - Number of Visits  30    PT Start Time  0905    PT Stop Time  0945    PT Time Calculation (min)  40 min    Activity Tolerance  Patient tolerated treatment well    Behavior During Therapy  [facility] for tasks assessed/performed       Past Medical History:  Diagnosis Date  . Cancer [facility])    lymph node right neck  . Diabetes mellitus without complication ([place])   . Hypertension   . Migraines   . Neuromuscular disorder ([place])    neuropathy    Past Surgical History:  Procedure Laterality Date  . CHOLECYSTECTOMY    . lower back surgery     L4, L5, L3  . NECK SURGERY    . tubaligation      There were no vitals filed for this visit.  Subjective Assessment - 09/23/17 0911    Subjective  Pt states that her back is hurting her due to the weather.      Pertinent History  DM, B carpal tunnel, cervical and lumbar surgery, COPD    Limitations  Standing;Walking;Lifting    How long can you stand comfortably?  Able to stand for 5-10 minutes     How long can you walk comfortably?  Pt is able to walk without an assistive device.  Last summer she was able to walk a mile now she can only walk for 10 to 15 minutes.      Patient Stated Goals  Walk better,     Currently in Pain?  Yes This is not why we are seeing pt this is chronic    Pain Score  7     Pain Location  Back    Pain Orientation  Lower    Pain Descriptors / Indicators  Aching    Pain Type  Chronic pain    Pain Onset  More  than a month ago    Aggravating Factors   rainy weather     Pain Relieving Factors  nothing     Pain Onset  More than a month ago                           Balance Exercises - 09/23/17 0912      Balance Exercises: Standing   Standing Eyes Opened  Narrow base of support (BOS);Foam/compliant surface;3 reps reach Rt and Lt outside base of support     Tandem Stance  Eyes open;3 reps    SLS  Eyes open;3 reps    Rockerboard  Lateral 2 minutes    Tandem Gait  Forward;2 reps    Retro Gait  2 reps    Sidestepping  2 reps;Theraband    Marching Limitations  10    Heel Raises Limitations  10    Sit to Stand Time  10  PT Education - 10/18/17 1155    Education provided  Yes    Education Details  HEP; proper way to transition from sit to supine     Person(s) Educated  Patient    Methods  Explanation;Handout    Comprehension  Verbalized understanding;Returned demonstration       PT Short Term Goals - 2017-10-18 1204      PT SHORT TERM GOAL #1   Title  Pt to be able to single leg stance for 10 seconds bilaterally to decrease fall risk     Time  2    Period  Weeks    Status  New    Target Date  10/06/17      PT SHORT TERM GOAL #2   Title  PT to be able to walk 15 minutes to be able to complete short shopping trips     Time  2    Period  Weeks    Status  New      PT SHORT TERM GOAL #3   Title  Pt strength of B LE to be improved 1/2 grade to allow pt to come sit to stand with increased ease.     Time  2    Period  Weeks    Status  New        PT Long Term Goals - 10-18-17 1207      PT LONG TERM GOAL #1   Title  PT to be able to single leg stance for 15 seconds on both LE to decrease risk of falling     Time  4    Period  Weeks    Status  New    Target Date  10/20/17      PT LONG TERM GOAL #2   Title  Pt to be I in home balance program in order to continue improving in her balance at discharge.    Time  4    Period  Weeks    Status  New       PT LONG TERM GOAL #3   Title  PT LE strength to be increased one grade to allow pt to come sit to stand without UE support.     Time  4    Period  Weeks    Status  New      PT LONG TERM GOAL #4   Title  PT to be able to walk for 30 mintues at a time to allow pt to go on shopping trips.     Time  4    Period  Weeks            Plan - 09/23/17 9629    Clinical Impression Statement  Evaluation and goals reviewed with patient.  Balance exercises initiated with moderate assist needed from therapiist     Rehab Potential  Good    PT Frequency  2x / week    PT Duration  4 weeks    PT Treatment/Interventions  Therapeutic activities;Therapeutic exercise;Balance training;Neuromuscular re-education;Patient/family education    PT Next Visit Plan  begin step ups and wall bumps        Patient will benefit from skilled therapeutic intervention in order to improve the following deficits and impairments:  Abnormal gait, Decreased activity tolerance, Decreased balance, Difficulty walking, Decreased strength  Visit Diagnosis: Repeated falls  Muscle weakness (generalized)   G-Codes - 10-18-17 1212    Functional Assessment Tool Used (Outpatient Only)  foto    Functional Limitation  Mobility: Walking and moving around    Mobility: Walking and Moving Around Current Status [phone])  At least 60 percent but less than 80 percent impaired, limited or restricted    Mobility: Walking and Moving Around Goal Status [phone])  At least 40 percent but less than 60 percent impaired, limited or restricted       Problem List Patient Active Problem List   Diagnosis Date Noted  . Tobacco abuse 12/31/2015  . Chest pain 12/30/2015  . Diabetes mellitus with complication ([place]) 53/66/4403  . Essential hypertension 12/30/2015  . IBS (irritable bowel syndrome) 12/30/2015  . GERD (gastroesophageal reflux disease) 12/30/2015  . Hypothyroid 12/30/2015  . Depression 12/30/2015  . Peripheral neuropathy 12/30/2015   . Chest pain of uncertain etiology   . Hypokalemia   . Unspecified hereditary and idiopathic peripheral neuropathy 07/19/2013  . Type II or unspecified type diabetes mellitus without mention of complication, not stated as uncontrolled 07/19/2013  . Abnormality of gait 07/19/2013  . Frequent falls 07/19/2013  . Uterovaginal prolapse, complete 02/06/2013    Rayetta Humphrey, PT CLT [phone] 09/23/2017, 9:46 AM  [place] [address] [place], [place], [address] Phone: [phone]   Fax:  [phone]  Name: Kelsey Conley MRN: [id number] Date of Birth: 1956-08-09

## 2017-09-27 ENCOUNTER — Ambulatory Visit (HOSPITAL_COMMUNITY): Payer: Medicare Other | Attending: Neurology | Admitting: Physical Therapy

## 2017-09-27 DIAGNOSIS — M6281 Muscle weakness (generalized): Secondary | ICD-10-CM

## 2017-09-27 DIAGNOSIS — R296 Repeated falls: Secondary | ICD-10-CM

## 2017-09-27 NOTE — Therapy (Addendum)
[place], [place], [address] Phone: [phone]   Fax:  [phone]  Physical Therapy Treatment  Patient Details  Name: Kelsey Conley MRN: [id number] Date of Birth: November 12, 1955 Referring Provider: Narda Amber    Encounter Date: 09/27/2017  PT End of Session - 09/27/17 0838    Visit Number  3   Number of Visits  8    Date for PT Re-Evaluation  10/22/17    Authorization Type  medicare    Authorization - Visit Number  2    Authorization - Number of Visits  30    PT Start Time  0815    PT Stop Time  0830    PT Time Calculation (min)  15 min    Activity Tolerance  Patient limited by pain;Patient limited by fatigue;Treatment limited secondary to agitation    Behavior During Therapy  [facility] for tasks assessed/performed       Past Medical History:  Diagnosis Date  . Cancer [facility])    lymph node right neck  . Diabetes mellitus without complication ([place])   . Hypertension   . Migraines   . Neuromuscular disorder ([place])    neuropathy    Past Surgical History:  Procedure Laterality Date  . CHOLECYSTECTOMY    . lower back surgery     L4, L5, L3  . NECK SURGERY    . tubaligation      There were no vitals filed for this visit.  Subjective Assessment - 09/27/17 0758    Subjective  Pt states that she is not doing well.  Her BP medication got recalled and her neurotin medication ran out; she gets her medication thru mail order and it takes up to two weeks to acquire her medication.  She states that she has not slept in two nights and she had chest pains all night long .     Pertinent History  DM, B carpal tunnel, cervical and lumbar surgery, COPD    Limitations  Standing;Walking;Lifting    How long can you stand comfortably?  Able to stand for 5-10 minutes     How long can you walk comfortably?  Pt is able to walk without an assistive device.  Last summer she was able to walk a mile now she can only walk for 10 to 15  minutes.      Patient Stated Goals  Walk better,     Currently in Pain?  Yes    Pain Score  -- Pt states pain is a 15/10     Pain Location  Leg    Pain Orientation  Right;Left    Pain Descriptors / Indicators  Numbness;Throbbing;Burning    Pain Type  Chronic pain    Pain Onset  More than a month ago    Pain Frequency  Constant    Aggravating Factors   no medication     Effect of Pain on Daily Activities  increases     Pain Onset  More than a month ago                              PT Education - 09/27/17 0834    Education provided  Yes    Education Details  PT smells heavily of smoke.  Therapist gave pt information on the free smoking cessation class and given sheet on the benefits when you stop smoking.  Pt also advised to call  her MD re:  no blood pressure medication for the past three days.  Therapist took pt BP and it was 160/90.  Repeated three minutes latter with BP still at 160/90.  PT seemed anxious stating that she needed to watch two children today; therapist advised against watching the children but pt stated that she had to watch the children     Person(s) Educated  Patient    Methods  Explanation;Handout    Comprehension  Verbalized understanding       PT Short Term Goals - 09/27/17 2229      PT SHORT TERM GOAL #1   Title  Pt to be able to single leg stance for 10 seconds bilaterally to decrease fall risk     Time  2    Period  Weeks    Status  On-going      PT SHORT TERM GOAL #2   Title  PT to be able to walk 15 minutes to be able to complete short shopping trips     Time  2    Period  Weeks    Status  On-going      PT SHORT TERM GOAL #3   Title  Pt strength of B LE to be improved 1/2 grade to allow pt to come sit to stand with increased ease.     Time  2    Period  Weeks    Status  On-going        PT Long Term Goals - 09/27/17 7989      PT LONG TERM GOAL #1   Title  PT to be able to single leg stance for 15 seconds on both LE to  decrease risk of falling     Time  4    Period  Weeks    Status  On-going      PT LONG TERM GOAL #2   Title  Pt to be I in home balance program in order to continue improving in her balance at discharge.    Time  4    Period  Weeks    Status  On-going      PT LONG TERM GOAL #3   Title  PT LE strength to be increased one grade to allow pt to come sit to stand without UE support.     Time  4    Period  Weeks    Status  On-going      PT LONG TERM GOAL #4   Title  PT to be able to walk for 30 mintues at a time to allow pt to go on shopping trips.     Time  4    Period  Weeks    Status  On-going            Plan - 09/27/17 0839    Clinical Impression Statement  PT educated on benefits of quitting smoking this session and urged to call MD regaurding being out of blood pressure medication.  Therapist opted to not treat patient this session due to pt stating that she had not slept in two days and she was having chest pain all night long.  Pt was encouraged to go to the ER if she is having chest pain but pt refused.     Rehab Potential  Good    PT Frequency  2x / week    PT Duration  4 weeks    PT Treatment/Interventions  Therapeutic activities;Therapeutic exercise;Balance training;Neuromuscular re-education;Patient/family education  PT Next Visit Plan   check BP;  begin step ups and wall bumps     Consulted and Agree with Plan of Care  Patient       Patient will benefit from skilled therapeutic intervention in order to improve the following deficits and impairments:  Abnormal gait, Decreased activity tolerance, Decreased balance, Difficulty walking, Decreased strength  Visit Diagnosis: Repeated falls  Muscle weakness (generalized)     Problem List Patient Active Problem List   Diagnosis Date Noted  . Tobacco abuse 12/31/2015  . Chest pain 12/30/2015  . Diabetes mellitus with complication ([place]) 62/94/7654  . Essential hypertension 12/30/2015  . IBS (irritable bowel  syndrome) 12/30/2015  . GERD (gastroesophageal reflux disease) 12/30/2015  . Hypothyroid 12/30/2015  . Depression 12/30/2015  . Peripheral neuropathy 12/30/2015  . Chest pain of uncertain etiology   . Hypokalemia   . Unspecified hereditary and idiopathic peripheral neuropathy 07/19/2013  . Type II or unspecified type diabetes mellitus without mention of complication, not stated as uncontrolled 07/19/2013  . Abnormality of gait 07/19/2013  . Frequent falls 07/19/2013  . Uterovaginal prolapse, complete 02/06/2013  Rayetta Humphrey, PT CLT [phone] 09/27/2017, 8:47 AM  [place] [address] [place], [place], [address] Phone: [phone]   Fax:  [phone]  Name: Kelsey Conley MRN: [id number] Date of Birth: 1956-08-14

## 2017-09-29 ENCOUNTER — Telehealth (HOSPITAL_COMMUNITY): Payer: Self-pay

## 2017-09-29 ENCOUNTER — Ambulatory Visit (HOSPITAL_COMMUNITY): Payer: Medicare Other

## 2017-09-29 NOTE — Telephone Encounter (Signed)
No show # 1. I called the home phone number on file for Ms. Kush and spoke directly to her. She informed me that she still has not gotten back on her BP medication and is not feeling well. She called her mail order pharmacy and they told her the medicine should arrive in 7-10 days. I educated her to call her PCP and discuss her concern about the medication and get a prescription to cover her at a local pharmacy until her medicine arrives. I reviewed education on red flags for cardiac concerns such as chest pain, shortness of press, shoulder pain, and to seek emergency care if any of these symptoms occur. Patient stated she understood the symptoms to be aware of. I then informed her of her next two appointments: Monday 12/10 at 8:15 am, and Friday 12/14 at 8:15 AM. Patient was instructed to call and cancel on Monday if she cannot make it due to weather.  Debara Pickett, PT, DPT Physical Therapist with [facility]  09/29/2017 9:27 AM

## 2017-09-30 ENCOUNTER — Telehealth (HOSPITAL_COMMUNITY): Payer: Self-pay | Admitting: Family Medicine

## 2017-09-30 NOTE — Telephone Encounter (Signed)
09/30/17  pt said to cx - her dr is just now refilling her blood pressure medicine

## 2017-10-03 ENCOUNTER — Ambulatory Visit (HOSPITAL_COMMUNITY): Payer: Medicare Other

## 2017-10-07 ENCOUNTER — Ambulatory Visit (HOSPITAL_COMMUNITY): Payer: Medicare Other | Admitting: Physical Therapy

## 2017-10-10 DIAGNOSIS — E1139 Type 2 diabetes mellitus with other diabetic ophthalmic complication: Secondary | ICD-10-CM | POA: Diagnosis not present

## 2017-10-10 DIAGNOSIS — K219 Gastro-esophageal reflux disease without esophagitis: Secondary | ICD-10-CM | POA: Diagnosis not present

## 2017-10-10 DIAGNOSIS — Z794 Long term (current) use of insulin: Secondary | ICD-10-CM | POA: Diagnosis not present

## 2017-10-10 DIAGNOSIS — E7800 Pure hypercholesterolemia, unspecified: Secondary | ICD-10-CM | POA: Diagnosis not present

## 2017-10-10 DIAGNOSIS — F17200 Nicotine dependence, unspecified, uncomplicated: Secondary | ICD-10-CM | POA: Diagnosis not present

## 2017-10-10 DIAGNOSIS — E039 Hypothyroidism, unspecified: Secondary | ICD-10-CM | POA: Diagnosis not present

## 2017-10-10 DIAGNOSIS — E1165 Type 2 diabetes mellitus with hyperglycemia: Secondary | ICD-10-CM | POA: Diagnosis not present

## 2017-10-10 DIAGNOSIS — E1140 Type 2 diabetes mellitus with diabetic neuropathy, unspecified: Secondary | ICD-10-CM | POA: Diagnosis not present

## 2017-10-10 DIAGNOSIS — F324 Major depressive disorder, single episode, in partial remission: Secondary | ICD-10-CM | POA: Diagnosis not present

## 2017-10-10 DIAGNOSIS — I10 Essential (primary) hypertension: Secondary | ICD-10-CM | POA: Diagnosis not present

## 2017-10-10 DIAGNOSIS — R609 Edema, unspecified: Secondary | ICD-10-CM | POA: Diagnosis not present

## 2017-10-10 DIAGNOSIS — Z0000 Encounter for general adult medical examination without abnormal findings: Secondary | ICD-10-CM | POA: Diagnosis not present

## 2017-10-10 DIAGNOSIS — E1142 Type 2 diabetes mellitus with diabetic polyneuropathy: Secondary | ICD-10-CM | POA: Diagnosis not present

## 2017-10-11 ENCOUNTER — Telehealth (HOSPITAL_COMMUNITY): Payer: Self-pay | Admitting: Physical Therapy

## 2017-10-11 ENCOUNTER — Ambulatory Visit (HOSPITAL_COMMUNITY): Payer: Medicare Other | Admitting: Physical Therapy

## 2017-10-11 NOTE — Telephone Encounter (Signed)
Called pt regarding missed appointment.  The individual who answered the phone stated that the patient was in but asked who was calling.  Once I identified myself the individual stated that the pt was sleeping and he would have her call us when she woke up.  Rayetta Humphrey, [place] CLT [phone]

## 2017-10-14 ENCOUNTER — Ambulatory Visit (HOSPITAL_COMMUNITY): Payer: Medicare Other | Admitting: Physical Therapy

## 2017-12-15 DIAGNOSIS — E1165 Type 2 diabetes mellitus with hyperglycemia: Secondary | ICD-10-CM | POA: Diagnosis not present

## 2017-12-15 DIAGNOSIS — Z794 Long term (current) use of insulin: Secondary | ICD-10-CM | POA: Diagnosis not present

## 2017-12-15 DIAGNOSIS — Z5181 Encounter for therapeutic drug level monitoring: Secondary | ICD-10-CM | POA: Diagnosis not present

## 2017-12-15 DIAGNOSIS — E1140 Type 2 diabetes mellitus with diabetic neuropathy, unspecified: Secondary | ICD-10-CM | POA: Diagnosis not present

## 2017-12-15 DIAGNOSIS — Z720 Tobacco use: Secondary | ICD-10-CM | POA: Diagnosis not present

## 2017-12-15 DIAGNOSIS — E039 Hypothyroidism, unspecified: Secondary | ICD-10-CM | POA: Diagnosis not present

## 2018-02-02 DIAGNOSIS — R58 Hemorrhage, not elsewhere classified: Secondary | ICD-10-CM | POA: Diagnosis not present

## 2018-02-02 DIAGNOSIS — J209 Acute bronchitis, unspecified: Secondary | ICD-10-CM | POA: Diagnosis not present

## 2018-02-02 DIAGNOSIS — F17200 Nicotine dependence, unspecified, uncomplicated: Secondary | ICD-10-CM | POA: Diagnosis not present

## 2018-02-02 DIAGNOSIS — M779 Enthesopathy, unspecified: Secondary | ICD-10-CM | POA: Diagnosis not present

## 2018-03-14 ENCOUNTER — Other Ambulatory Visit: Payer: Self-pay | Admitting: Family Medicine

## 2018-03-14 ENCOUNTER — Ambulatory Visit
Admission: RE | Admit: 2018-03-14 | Discharge: 2018-03-14 | Disposition: A | Discharge status: Home / Self Care | Payer: Medicare Other | Source: Ambulatory Visit | Attending: Family Medicine | Admitting: Family Medicine

## 2018-03-14 DIAGNOSIS — S79912A Unspecified injury of left hip, initial encounter: Secondary | ICD-10-CM | POA: Diagnosis not present

## 2018-03-14 DIAGNOSIS — W19XXXA Unspecified fall, initial encounter: Secondary | ICD-10-CM | POA: Diagnosis not present

## 2018-03-14 DIAGNOSIS — E1140 Type 2 diabetes mellitus with diabetic neuropathy, unspecified: Secondary | ICD-10-CM | POA: Diagnosis not present

## 2018-03-14 DIAGNOSIS — M25552 Pain in left hip: Secondary | ICD-10-CM

## 2018-03-14 DIAGNOSIS — J209 Acute bronchitis, unspecified: Secondary | ICD-10-CM

## 2018-03-14 DIAGNOSIS — Z720 Tobacco use: Secondary | ICD-10-CM | POA: Diagnosis not present

## 2018-03-14 DIAGNOSIS — E1165 Type 2 diabetes mellitus with hyperglycemia: Secondary | ICD-10-CM | POA: Diagnosis not present

## 2018-03-14 DIAGNOSIS — R05 Cough: Secondary | ICD-10-CM | POA: Diagnosis not present

## 2018-03-14 DIAGNOSIS — Z5181 Encounter for therapeutic drug level monitoring: Secondary | ICD-10-CM | POA: Diagnosis not present

## 2018-03-14 DIAGNOSIS — Z794 Long term (current) use of insulin: Secondary | ICD-10-CM | POA: Diagnosis not present

## 2018-03-14 DIAGNOSIS — E039 Hypothyroidism, unspecified: Secondary | ICD-10-CM | POA: Diagnosis not present

## 2018-05-05 DIAGNOSIS — Z1159 Encounter for screening for other viral diseases: Secondary | ICD-10-CM | POA: Diagnosis not present

## 2018-05-05 DIAGNOSIS — Z794 Long term (current) use of insulin: Secondary | ICD-10-CM | POA: Diagnosis not present

## 2018-05-05 DIAGNOSIS — I10 Essential (primary) hypertension: Secondary | ICD-10-CM | POA: Diagnosis not present

## 2018-05-05 DIAGNOSIS — K219 Gastro-esophageal reflux disease without esophagitis: Secondary | ICD-10-CM | POA: Diagnosis not present

## 2018-05-05 DIAGNOSIS — Z1231 Encounter for screening mammogram for malignant neoplasm of breast: Secondary | ICD-10-CM | POA: Diagnosis not present

## 2018-05-05 DIAGNOSIS — E039 Hypothyroidism, unspecified: Secondary | ICD-10-CM | POA: Diagnosis not present

## 2018-05-05 DIAGNOSIS — E1143 Type 2 diabetes mellitus with diabetic autonomic (poly)neuropathy: Secondary | ICD-10-CM | POA: Diagnosis not present

## 2018-05-20 DIAGNOSIS — Z1231 Encounter for screening mammogram for malignant neoplasm of breast: Secondary | ICD-10-CM | POA: Diagnosis not present

## 2018-07-06 ENCOUNTER — Encounter (HOSPITAL_COMMUNITY): Payer: Self-pay | Admitting: Physical Therapy

## 2018-07-06 NOTE — Therapy (Signed)
[facility] [place], [place], [address] Phone: [phone]   Fax:  [phone]  Patient Details  Name: Kelsey Conley MRN: [id number] Date of Birth: 30-Jan-1956 Referring Provider:  No ref. provider found  Encounter Date: 07/06/2018 PHYSICAL THERAPY DISCHARGE SUMMARY  Visits from Start of Care: 3  Current functional level related to goals / functional outcomes: Unknown pt did not return due to problems with her BP medication    Remaining deficits: unknown   Education / Equipment: HEP  Plan: Patient agrees to discharge.  Patient goals were not met. Patient is being discharged due to not returning since the last visit.  ?????       Rayetta Humphrey, PT CLT [phone] 07/06/2018, 9:00 AM  [facility] [place], [place], [address] Phone: [phone]   Fax:  [phone]

## 2018-07-17 DIAGNOSIS — J0100 Acute maxillary sinusitis, unspecified: Secondary | ICD-10-CM | POA: Diagnosis not present

## 2018-07-17 DIAGNOSIS — J209 Acute bronchitis, unspecified: Secondary | ICD-10-CM | POA: Diagnosis not present

## 2018-07-17 DIAGNOSIS — R05 Cough: Secondary | ICD-10-CM | POA: Diagnosis not present

## 2018-08-04 DIAGNOSIS — J0111 Acute recurrent frontal sinusitis: Secondary | ICD-10-CM | POA: Diagnosis not present

## 2018-08-04 DIAGNOSIS — I10 Essential (primary) hypertension: Secondary | ICD-10-CM | POA: Diagnosis not present

## 2018-08-04 DIAGNOSIS — J209 Acute bronchitis, unspecified: Secondary | ICD-10-CM | POA: Diagnosis not present

## 2018-08-04 DIAGNOSIS — E1143 Type 2 diabetes mellitus with diabetic autonomic (poly)neuropathy: Secondary | ICD-10-CM | POA: Diagnosis not present

## 2018-08-04 DIAGNOSIS — Z794 Long term (current) use of insulin: Secondary | ICD-10-CM | POA: Diagnosis not present

## 2018-08-04 DIAGNOSIS — E039 Hypothyroidism, unspecified: Secondary | ICD-10-CM | POA: Diagnosis not present

## 2018-08-24 DIAGNOSIS — H35031 Hypertensive retinopathy, right eye: Secondary | ICD-10-CM | POA: Diagnosis not present

## 2018-10-05 DIAGNOSIS — K219 Gastro-esophageal reflux disease without esophagitis: Secondary | ICD-10-CM | POA: Diagnosis not present

## 2018-10-05 DIAGNOSIS — I10 Essential (primary) hypertension: Secondary | ICD-10-CM | POA: Diagnosis not present

## 2018-10-05 DIAGNOSIS — E039 Hypothyroidism, unspecified: Secondary | ICD-10-CM | POA: Diagnosis not present

## 2018-10-05 DIAGNOSIS — Z23 Encounter for immunization: Secondary | ICD-10-CM | POA: Diagnosis not present

## 2018-10-05 DIAGNOSIS — Z794 Long term (current) use of insulin: Secondary | ICD-10-CM | POA: Diagnosis not present

## 2018-10-05 DIAGNOSIS — E1143 Type 2 diabetes mellitus with diabetic autonomic (poly)neuropathy: Secondary | ICD-10-CM | POA: Diagnosis not present

## 2018-10-05 DIAGNOSIS — Z0000 Encounter for general adult medical examination without abnormal findings: Secondary | ICD-10-CM | POA: Diagnosis not present
